# Patient Record
Sex: Male | Born: 1960 | Race: Black or African American | Hispanic: No | Marital: Single | Smoking: Never smoker
Health system: Southern US, Community
[De-identification: ages and names within clinical notes are randomized; demographics above are authoritative.]

## PROBLEM LIST (undated history)

## (undated) DIAGNOSIS — K4090 Unilateral inguinal hernia, without obstruction or gangrene, not specified as recurrent: Secondary | ICD-10-CM

## (undated) DIAGNOSIS — I350 Nonrheumatic aortic (valve) stenosis: Secondary | ICD-10-CM

## (undated) DIAGNOSIS — N400 Enlarged prostate without lower urinary tract symptoms: Secondary | ICD-10-CM

## (undated) DIAGNOSIS — C801 Malignant (primary) neoplasm, unspecified: Secondary | ICD-10-CM

## (undated) DIAGNOSIS — R011 Cardiac murmur, unspecified: Secondary | ICD-10-CM

## (undated) DIAGNOSIS — I10 Essential (primary) hypertension: Secondary | ICD-10-CM

## (undated) DIAGNOSIS — N189 Chronic kidney disease, unspecified: Secondary | ICD-10-CM

## (undated) HISTORY — PX: NO PAST SURGERIES: SHX2092

---

## 2019-05-26 ENCOUNTER — Other Ambulatory Visit: Payer: Self-pay | Admitting: Urology

## 2019-05-26 ENCOUNTER — Other Ambulatory Visit (HOSPITAL_COMMUNITY): Payer: Self-pay | Admitting: Urology

## 2019-05-26 DIAGNOSIS — C61 Malignant neoplasm of prostate: Secondary | ICD-10-CM

## 2019-06-23 ENCOUNTER — Encounter (HOSPITAL_COMMUNITY)
Admission: RE | Admit: 2019-06-23 | Discharge: 2019-06-23 | Disposition: A | Discharge status: Home / Self Care | Payer: BC Managed Care – PPO | Source: Ambulatory Visit | Attending: Urology | Admitting: Urology

## 2019-06-23 ENCOUNTER — Other Ambulatory Visit: Payer: Self-pay

## 2019-06-23 DIAGNOSIS — C61 Malignant neoplasm of prostate: Secondary | ICD-10-CM | POA: Insufficient documentation

## 2019-06-23 MED ORDER — TECHNETIUM TC 99M MEDRONATE IV KIT
21.8000 | PACK | Freq: Once | INTRAVENOUS | Status: AC | PRN
  Administered 2019-06-23: 11:00:00 21.8 via INTRAVENOUS

## 2019-07-09 ENCOUNTER — Other Ambulatory Visit (HOSPITAL_COMMUNITY): Payer: Self-pay | Admitting: Urology

## 2019-07-09 ENCOUNTER — Ambulatory Visit (HOSPITAL_COMMUNITY)
Admission: RE | Admit: 2019-07-09 | Discharge: 2019-07-09 | Disposition: A | Discharge status: Home / Self Care | Payer: BC Managed Care – PPO | Source: Ambulatory Visit | Attending: Urology | Admitting: Urology

## 2019-07-09 ENCOUNTER — Other Ambulatory Visit: Payer: Self-pay

## 2019-07-09 DIAGNOSIS — C61 Malignant neoplasm of prostate: Secondary | ICD-10-CM | POA: Diagnosis present

## 2019-07-24 ENCOUNTER — Other Ambulatory Visit: Payer: Self-pay | Admitting: Urology

## 2019-08-20 NOTE — Patient Instructions (Signed)
DUE TO COVID-19 ONLY ONE VISITOR IS ALLOWED TO COME WITH YOU AND STAY IN THE WAITING ROOM ONLY DURING PRE OP AND PROCEDURE DAY OF SURGERY. THE 1 VISITOR MAY VISIT WITH YOU AFTER SURGERY IN YOUR PRIVATE ROOM DURING VISITING HOURS ONLY!  YOU NEED TO HAVE A COVID 19 TEST ON_2/22/21______ @  1:45_______, THIS TEST MUST BE DONE BEFORE SURGERY, COME  [place], [place] , [address].  ([place]) ONCE YOUR COVID TEST IS COMPLETED, PLEASE BEGIN THE QUARANTINE INSTRUCTIONS AS OUTLINED IN YOUR HANDOUT.                Dollene Cleveland Nanda    Your procedure is scheduled on: 08/27/19   Report to [facility] Main  Entrance   Report to admitting at  9:15 AM     Call this number if you have problems the morning of surgery [phone]    Remember: Do not eat food or drink liquids :After Midnight  . BRUSH YOUR TEETH MORNING OF SURGERY AND RINSE YOUR MOUTH OUT, NO CHEWING GUM CANDY OR MINTS.     Take these medicines the morning of surgery with A SIP OF WATER: none  DO NOT TAKE ANY DIABETIC MEDICATIONS DAY OF YOUR SURGERY                               You may not have any metal on your body including              piercings  Do not wear jewelry,  lotions, powders or  deodorant                   Men may shave face and neck.   Do not bring valuables to the hospital. [place].  Contacts, dentures or bridgework may not be worn into surgery.       Special Instructions: N/A              Please read over the following fact sheets you were given: _____________________________________________________________________             [facility] - Preparing for Surgery  Before surgery, you can play an important role.   Because skin is not sterile, your skin needs to be as free of germs as possible.   You can reduce the number of germs on your skin by washing with CHG (chlorahexidine gluconate) soap before surgery.   CHG is  an antiseptic cleaner which kills germs and bonds with the skin to continue killing germs even after washing. Please DO NOT use if you have an allergy to CHG or antibacterial soaps.   If your skin becomes reddened/irritated stop using the CHG and inform your nurse when you arrive at Short Stay.   You may shave your face/neck.  Please follow these instructions carefully:  1.  Shower with CHG Soap the night before surgery and the  morning of Surgery.  2.  If you choose to wash your hair, wash your hair first as usual with your  normal  shampoo.  3.  After you shampoo, rinse your hair and body thoroughly to remove the  shampoo.  4.  Use CHG as you would any other liquid soap.  You can apply chg directly  to the skin and wash                       Gently with a scrungie or clean washcloth.  5.  Apply the CHG Soap to your body ONLY FROM THE NECK DOWN.   Do not use on face/ open                           Wound or open sores. Avoid contact with eyes, ears mouth and genitals (private parts).                       Wash face,  Genitals (private parts) with your normal soap.             6.  Wash thoroughly, paying special attention to the area where your surgery  will be performed.  7.  Thoroughly rinse your body with warm water from the neck down.  8.  DO NOT shower/wash with your normal soap after using and rinsing off  the CHG Soap.             9.  Pat yourself dry with a clean towel.            10.  Wear clean pajamas.            11.  Place clean sheets on your bed the night of your first shower and do not  sleep with pets. Day of Surgery : Do not apply any lotions/deodorants the morning of surgery.  Please wear clean clothes to the hospital/surgery center.  FAILURE TO FOLLOW THESE INSTRUCTIONS MAY RESULT IN THE CANCELLATION OF YOUR SURGERY PATIENT SIGNATURE_________________________________  NURSE  SIGNATURE__________________________________  ________________________________________________________________________   Adam Phenix  An incentive spirometer is a tool that can help keep your lungs clear and active. This tool measures how well you are filling your lungs with each breath. Taking long deep breaths may help reverse or decrease the chance of developing breathing (pulmonary) problems (especially infection) following:  A long period of time when you are unable to move or be active. BEFORE THE PROCEDURE   If the spirometer includes an indicator to show your best effort, your nurse or respiratory therapist will set it to a desired goal.  If possible, sit up straight or lean slightly forward. Try not to slouch.  Hold the incentive spirometer in an upright position. INSTRUCTIONS FOR USE  1. Sit on the edge of your bed if possible, or sit up as far as you can in bed or on a chair. 2. Hold the incentive spirometer in an upright position. 3. Breathe out normally. 4. Place the mouthpiece in your mouth and seal your lips tightly around it. 5. Breathe in slowly and as deeply as possible, raising the piston or the ball toward the top of the column. 6. Hold your breath for 3-5 seconds or for as long as possible. Allow the piston or ball to fall to the bottom of the column. 7. Remove the mouthpiece from your mouth and breathe out normally. 8. Rest for a few seconds and repeat Steps 1 through 7 at least 10 times every 1-2 hours when you are awake. Take your time and take a few normal breaths between deep breaths. 9. The spirometer may include an indicator to show your best effort.  Use the indicator as a goal to work toward during each repetition. 10. After each set of 10 deep breaths, practice coughing to be sure your lungs are clear. If you have an incision (the cut made at the time of surgery), support your incision when coughing by placing a pillow or rolled up towels firmly  against it. Once you are able to get out of bed, walk around indoors and cough well. You may stop using the incentive spirometer when instructed by your caregiver.  RISKS AND COMPLICATIONS  Take your time so you do not get dizzy or light-headed.  If you are in pain, you may need to take or ask for pain medication before doing incentive spirometry. It is harder to take a deep breath if you are having pain. AFTER USE  Rest and breathe slowly and easily.  It can be helpful to keep track of a log of your progress. Your caregiver can provide you with a simple table to help with this. If you are using the spirometer at home, follow these instructions: [place] IF:   You are having difficultly using the spirometer.  You have trouble using the spirometer as often as instructed.  Your pain medication is not giving enough relief while using the spirometer.  You develop fever of 100.5 F (38.1 C) or higher. SEEK IMMEDIATE MEDICAL CARE IF:   You cough up bloody sputum that had not been present before.  You develop fever of 102 F (38.9 C) or greater.  You develop worsening pain at or near the incision site. MAKE SURE YOU:   Understand these instructions.  Will watch your condition.  Will get help right away if you are not doing well or get worse. Document Released: 10/29/2006 Document Revised: 09/10/2011 Document Reviewed: 12/30/2006 Exit[employer] Patient Information 2014 [employer], [place].   ________________________________________________________________________  WHAT IS A BLOOD TRANSFUSION? Blood Transfusion Information  A transfusion is the replacement of blood or some of its parts. Blood is made up of multiple cells which provide different functions.  Red blood cells carry oxygen and are used for blood loss replacement.  White blood cells fight against infection.  Platelets control bleeding.  Plasma helps clot blood.  Other blood products are available for  specialized needs, such as hemophilia or other clotting disorders. BEFORE THE TRANSFUSION  Who gives blood for transfusions?   Healthy volunteers who are fully evaluated to make sure their blood is safe. This is blood bank blood. Transfusion therapy is the safest it has ever been in the practice of medicine. Before blood is taken from a donor, a complete history is taken to make sure that person has no history of diseases nor engages in risky social behavior (examples are intravenous drug use or sexual activity with multiple partners). The donor's travel history is screened to minimize risk of transmitting infections, such as malaria. The donated blood is tested for signs of infectious diseases, such as HIV and hepatitis. The blood is then tested to be sure it is compatible with you in order to minimize the chance of a transfusion reaction. If you or a relative donates blood, this is often done in anticipation of surgery and is not appropriate for emergency situations. It takes many days to process the donated blood. RISKS AND COMPLICATIONS Although transfusion therapy is very safe and saves many lives, the main dangers of transfusion include:   Getting an infectious disease.  Developing a transfusion reaction. This is an allergic reaction to something in the  blood you were given. Every precaution is taken to prevent this. The decision to have a blood transfusion has been considered carefully by your caregiver before blood is given. Blood is not given unless the benefits outweigh the risks. AFTER THE TRANSFUSION  Right after receiving a blood transfusion, you will usually feel much better and more energetic. This is especially true if your red blood cells have gotten low (anemic). The transfusion raises the level of the red blood cells which carry oxygen, and this usually causes an energy increase.  The nurse administering the transfusion will monitor you carefully for complications. HOME CARE  INSTRUCTIONS  No special instructions are needed after a transfusion. You may find your energy is better. Speak with your caregiver about any limitations on activity for underlying diseases you may have. SEEK MEDICAL CARE IF:   Your condition is not improving after your transfusion.  You develop redness or irritation at the intravenous (IV) site. SEEK IMMEDIATE MEDICAL CARE IF:  Any of the following symptoms occur over the next 12 hours:  Shaking chills.  You have a temperature by mouth above 102 F (38.9 C), not controlled by medicine.  Chest, back, or muscle pain.  People around you feel you are not acting correctly or are confused.  Shortness of breath or difficulty breathing.  Dizziness and fainting.  You get a rash or develop hives.  You have a decrease in urine output.  Your urine turns a dark color or changes to pink, red, or brown. Any of the following symptoms occur over the next 10 days:  You have a temperature by mouth above 102 F (38.9 C), not controlled by medicine.  Shortness of breath.  Weakness after normal activity.  The white part of the eye turns yellow (jaundice).  You have a decrease in the amount of urine or are urinating less often.  Your urine turns a dark color or changes to pink, red, or brown. Document Released: 06/15/2000 Document Revised: 09/10/2011 Document Reviewed: 02/02/2008 [facility] Patient Information 2014 [place], [place].  _______________________________________________________________________

## 2019-08-21 ENCOUNTER — Encounter (HOSPITAL_COMMUNITY)
Admission: RE | Admit: 2019-08-21 | Discharge: 2019-08-21 | Disposition: A | Discharge status: Home / Self Care | Payer: BC Managed Care – PPO | Source: Ambulatory Visit | Attending: Urology | Admitting: Urology

## 2019-08-21 ENCOUNTER — Other Ambulatory Visit: Payer: Self-pay

## 2019-08-21 ENCOUNTER — Encounter (HOSPITAL_COMMUNITY): Payer: Self-pay

## 2019-08-21 HISTORY — DX: Malignant (primary) neoplasm, unspecified: C80.1

## 2019-08-24 ENCOUNTER — Encounter (HOSPITAL_COMMUNITY)
Admission: RE | Admit: 2019-08-24 | Discharge: 2019-08-24 | Disposition: A | Discharge status: Home / Self Care | Payer: BC Managed Care – PPO | Source: Ambulatory Visit | Attending: Urology | Admitting: Urology

## 2019-08-24 ENCOUNTER — Other Ambulatory Visit (HOSPITAL_COMMUNITY)
Admission: RE | Admit: 2019-08-24 | Discharge: 2019-08-24 | Disposition: A | Discharge status: Home / Self Care | Payer: BC Managed Care – PPO | Source: Ambulatory Visit | Attending: Urology | Admitting: Urology

## 2019-08-24 ENCOUNTER — Other Ambulatory Visit: Payer: Self-pay

## 2019-08-24 DIAGNOSIS — Z01812 Encounter for preprocedural laboratory examination: Secondary | ICD-10-CM | POA: Insufficient documentation

## 2019-08-24 LAB — BASIC METABOLIC PANEL
Anion gap: 8 (ref 5–15)
BUN: 18 mg/dL (ref 6–20)
CO2: 25 mmol/L (ref 22–32)
Calcium: 9.4 mg/dL (ref 8.9–10.3)
Chloride: 106 mmol/L (ref 98–111)
Creatinine, Ser: 1.22 mg/dL (ref 0.61–1.24)
GFR calc Af Amer: >60 mL/min (ref >60)
GFR calc non Af Amer: >60 mL/min (ref >60)
Glucose, Bld: 112 mg/dL — ABNORMAL HIGH (ref 70–99)
Potassium: 4.5 mmol/L (ref 3.5–5.1)
Sodium: 139 mmol/L (ref 135–145)

## 2019-08-24 LAB — CBC
HCT: 42.5 % (ref 39.0–52.0)
Hemoglobin: 14.5 g/dL (ref 13.0–17.0)
MCH: 33 pg (ref 26.0–34.0)
MCHC: 34.1 g/dL (ref 30.0–36.0)
MCV: 96.6 fL (ref 80.0–100.0)
Platelets: 176 10*3/uL (ref 150–400)
RBC: 4.4 MIL/uL (ref 4.22–5.81)
RDW: 12.4 % (ref 11.5–15.5)
WBC: 3.6 10*3/uL — ABNORMAL LOW (ref 4.0–10.5)
nRBC: 0 % (ref 0.0–0.2)

## 2019-08-24 LAB — SARS CORONAVIRUS 2 (TAT 6-24 HRS): SARS Coronavirus 2: NEGATIVE

## 2019-08-24 LAB — SURGICAL PCR SCREEN
MRSA, PCR: NEGATIVE
Staphylococcus aureus: POSITIVE — AB

## 2019-08-24 LAB — ABO/RH: ABO/RH(D): O POS

## 2019-08-25 NOTE — Progress Notes (Signed)
PCR result routed to Dr. Alinda Money for review

## 2019-08-26 NOTE — H&P (Signed)
CC: Prostate Cancer    Kevin Wilson is a 59 year old gentleman who was noted to have an elevated PSA of 13.88 and a left apical 1 cm nodule. 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   Family history: He apparently has 2 uncles that have had prostate cancer.   Imaging studies:  CT scan (06/23/19): Negative for metastatic disease.  Bone scan (06/23/19): Mild uptake in cervical, thoracic, and lumbar spine that could be degenerative or metastatic disease  Plain films (07/08/18): Cervical, thoracic, and lumbar plain films suggest degenerative changes without metastatic lesions   PMH: He no medical comorbid conditions.  PSH: No abdominal surgeries.   TNM stage: cT2 N0 M0  PSA: 13.38  Gleason score: 4+5=9 (Grade group 5)  Biopsy (05/14/19): 6/12 cores positive  Left: L lateral mid (70%, 4+3=7), L mid (40%, 3+4=7), L lateral base (90%, 4+3=7, PNI, EPE), L base (70%, 4+5=9, PNI)  Right: R mid (10%, 4+3=7), R base (40%, 4+5=9)  Prostate volume: 19.6 cc   Nomogram  OC disease: 9%  EPE: 88%  SVI: 46%  LNI: 47%  PFS (5 year, 10 year): 28%, 17%   Urinary function: IPSS is 2.  Erectile function: SHIM score is 18. He can reliably have erections adequate for intercourse without the need for medical therapy.     ALLERGIES: NKDA    MEDICATIONS: None   GU PSH: Locm 300-399Mg /Ml Iodine,1Ml - 06/23/2019 Prostate Needle Biopsy - 05/14/2019 Vasectomy     NON-GU PSH: Surgical Pathology, Gross And Microscopic Examination For Prostate Needle - 05/14/2019     GU PMH: Prostate Cancer - 07/13/2019, The PSA over 10 and the presence of Gleason 9 on his biopsy we discussed the fact that he is at high risk and because of that imaging of the bones as well as a CT scan would be obtained. Once I have these results we can then discuss options for treatment., -  05/26/2019 Prostate nodule w/o LUTS, He does have a left apical nodule noted on exam. In addition with an elevated PSA I think he almost certainly is going to need undergo prostate biopsy regardless of what his PSA returns. - 04/09/2019    NON-GU PMH: None   FAMILY HISTORY: 1 Daughter - Daughter   SOCIAL HISTORY: Marital Status: Single Preferred Language: English; Ethnicity: Not Hispanic Or Latino; Race: Black or African American Current Smoking Status: Patient has never smoked.   Tobacco Use Assessment Completed: Used Tobacco in last 30 days? Has never drank.  Drinks 4+ caffeinated drinks per day.    REVIEW OF SYSTEMS:    GU Review Male:   Patient denies frequent urination, hard to postpone urination, burning/ pain with urination, get up at night to urinate, leakage of urine, stream starts and stops, trouble starting your streams, and have to strain to urinate .  Gastrointestinal (Lower):   Patient denies diarrhea and constipation.  Gastrointestinal (Upper):   Patient denies vomiting and nausea.  Constitutional:   Patient denies fever, night sweats, weight loss, and fatigue.  Skin:   Patient denies skin rash/ lesion and itching.  Eyes:   Patient denies blurred vision and double vision.  Ears/ Nose/ Throat:   Patient denies sore throat and sinus problems.  Hematologic/Lymphatic:   Patient denies swollen glands and easy bruising.  Cardiovascular:   Patient denies leg swelling and chest pains.  Respiratory:   Patient denies cough and shortness of breath.  Endocrine:   Patient denies excessive thirst.  Musculoskeletal:   Patient denies back pain and joint pain.  Neurological:   Patient denies headaches and dizziness.  Psychologic:   Patient denies depression and anxiety.   VITAL SIGNS:     Weight 215 lb / 97.52 kg  Height 73.5 in / 186.69 cm  BMI 28.0 kg/m   MULTI-SYSTEM PHYSICAL EXAMINATION:    Constitutional: Well-nourished. No physical deformities. Normally developed. Good  grooming.  Neck: Neck symmetrical, not swollen. Normal tracheal position.  Respiratory: No labored breathing, no use of accessory muscles. Clear bilaterally.  Cardiovascular: Normal temperature, normal extremity pulses, no swelling, no varicosities. Regular rate and rhythm.  Lymphatic: No enlargement of neck, axillae, groin.  Skin: No paleness, no jaundice, no cyanosis. No lesion, no ulcer, no rash.  Neurologic / Psychiatric: Oriented to time, oriented to place, oriented to person. No depression, no anxiety, no agitation.  Gastrointestinal: No mass, no tenderness, no rigidity, non obese abdomen.  Eyes: Normal conjunctivae. Normal eyelids.  Ears, Nose, Mouth, and Throat: Left ear no scars, no lesions, no masses. Right ear no scars, no lesions, no masses. Nose no scars, no lesions, no masses. Normal hearing. Normal lips.  Musculoskeletal: Normal gait and station of head and neck.     PAST DATA REVIEWED:  Source Of History:  Patient  Lab Test Review:   PSA  Records Review:   Pathology Reports, Previous Patient Records  X-Ray Review: Outside X-Ray: Reviewed Films.  C.T. Abdomen/Pelvis: Reviewed Films.  Bone Scan: Reviewed Films.     04/09/19  PSA  Total PSA 10.50 ng/mL   Notes:                     CLINICAL DATA: New diagnosis of prostate cancer.   EXAM:  CT ABDOMEN AND PELVIS WITH CONTRAST   TECHNIQUE:  Multidetector CT imaging of the abdomen and pelvis was performed  using the standard protocol following bolus administration of  intravenous contrast.   CONTRAST: 100 cc Omnipaque 300   COMPARISON: None.   FINDINGS:  Lower chest: The lung bases are clear of acute process. No pleural  effusion or pulmonary lesions. The heart is normal in size. No  pericardial effusion. The distal esophagus and aorta are  unremarkable.   Hepatobiliary: No focal hepatic lesions or intrahepatic biliary  dilatation. The gallbladder appears normal. No common bile duct  dilatation.   Pancreas: No  mass, inflammation or ductal dilatation.   Spleen: Normal size. No focal lesions.   Adrenals/Urinary Tract: Adrenal glands are normal.   No renal, ureteral or bladder calculi or mass. The delayed images do  not demonstrate any significant collecting system abnormalities.   Stomach/Bowel: The stomach, duodenum, small bowel and colon are  grossly normal without oral contrast. No inflammatory changes, mass  lesions or obstructive findings. The terminal ileum and appendix are  normal.   Vascular/Lymphatic: The aorta is normal in caliber. No dissection.  The branch vessels are patent. The major venous structures are  patent. No mesenteric or retroperitoneal mass or adenopathy. Small  scattered lymph nodes are noted.   Reproductive: The prostate gland and seminal vesicles are  unremarkable.   Other: Slightly prominent inguinal rings containing fat. No  abdominal wall hernia.   Musculoskeletal: Small benign-appearing lipoma noted in the external  oblique muscle on the right side. The bony structures are  unremarkable. No worrisome sclerotic bone lesions to suggest  metastatic disease. Moderate facet disease noted in the lower lumbar  spine.   Moderate bilateral SI joint degenerative changes.   IMPRESSION:  1. No CT findings to suggest metastatic prostate cancer.  2. No acute abdominal/pelvic findings, mass lesions or adenopathy.    Electronically Signed  By: Marijo Sanes M.D.  On: 06/23/2019 11:15   CLINICAL DATA: Prostate cancer.   EXAM:  NUCLEAR MEDICINE WHOLE BODY BONE SCAN   TECHNIQUE:  Whole body anterior and posterior images were obtained approximately  3 hours after intravenous injection of radiopharmaceutical.   RADIOPHARMACEUTICALS: 21.8 mCi Technetium-49m MDP IV   COMPARISON: CT 06/23/2019.   FINDINGS:  Bilateral renal function excretion. Mild increased activity noted  over both shoulders and sternoclavicular joints, most likely  degenerative. Mild focal  areas of increased activity noted over the  cervical, thoracic, and lumbar spine. These may be degenerative.  Cervical spine, thoracic, and lumbar spine series can be obtained to  further evaluate. Mild increased activity noted over both knees,  most likely degenerative.   IMPRESSION:  1. Mild increased activity noted over both shoulders and  sternoclavicular joints. Mild increased activity noted over both  knees. These findings are most likely secondary to degenerative  disease.   2. Mild focal areas of increased activity noted over the cervical,  thoracic, and lumbar spine. These may be degenerative. Cervical  spine, thoracic spine, and lumbar spine series can be obtained to  further evaluate.    Electronically Signed  By: Marcello Moores Register  On: 06/24/2019 05:59   CLINICAL DATA: Known history of prostate carcinoma.   EXAM:  LUMBAR SPINE - COMPLETE 4+ VIEW   COMPARISON: None.   FINDINGS:  Five lumbar type vertebral bodies are well visualized. Vertebral  body height is well maintained. No pars defects are seen. Mild  osteophytic changes are seen. Mild disc space narrowing at L4-5 and  L5-S1 is noted. No sclerotic lesions are identified.   IMPRESSION:  Degenerative change without evidence of metastatic disease.    Electronically Signed  By: Inez Catalina M.D.  On: 07/09/2019 21:02   CLINICAL DATA: History of prostate carcinoma   EXAM:  THORACIC SPINE 2 VIEWS   COMPARISON: None.   FINDINGS:  Pedicles are within normal limits. No paraspinal mass is noted. Mild  osteophytic changes of the thoracic spine are seen. No compression  deformity is noted. No sclerotic foci are noted.   IMPRESSION:  Degenerative change without acute abnormality.    Electronically Signed  By: Inez Catalina M.D.  On: 07/09/2019 21:03   CLINICAL DATA: Prostate carcinoma, possible metastatic disease.   EXAM:  CERVICAL SPINE - COMPLETE 4+ VIEW   COMPARISON: None.   FINDINGS:  Seven  cervical segments are well visualized. Vertebral body height  is well maintained. Disc space narrowing at C5-6 is noted.  Osteophytic changes are noted at C4-5, C5-6 and C6-7. No soft tissue  abnormality is noted.   IMPRESSION:  Multilevel degenerative changes are noted without acute abnormality.    Electronically Signed  By: Inez Catalina M.D.  On: 01/07/2  ASSESSMENT:      ICD-10 Details  1 GU:   Prostate Cancer - C61    PLAN:     1. Locally advanced, very high risk prostate cancer: I had a detailed discussion with Kevin Wilson today regarding his prostate cancer situation. He understands that he has high-risk but potentially curable disease based on the absence of any definite metastatic disease considering his imaging studies. We did discuss options for treatment  any has a clear understanding that he likely will require multi modality therapy. The patient was counseled about the natural history of prostate cancer and the standard treatment options that are available for prostate cancer. It was explained to him how his age and life expectancy, clinical stage, Gleason score, and PSA affect his prognosis, the decision to proceed with additional staging studies, as well as how that information influences recommended treatment strategies. We discussed the roles for active surveillance, radiation therapy, surgical therapy, androgen deprivation, as well as ablative therapy options for the treatment of prostate cancer as appropriate to his individual cancer situation. We discussed the risks and benefits of these options with regard to their impact on cancer control and also in terms of potential adverse events, complications, and impact on quality of life particularly related to urinary and sexual function. The patient was encouraged to ask questions throughout the discussion today and all questions were answered to his stated satisfaction. In addition, the patient was provided with and/or directed to  appropriate resources and literature for further education about prostate cancer and treatment options. We discussed surgical therapy for prostate cancer including the different available surgical approaches. We discussed, in detail, the risks and expectations of surgery with regard to cancer control, urinary control, and erectile function as well as the expected postoperative recovery process. Additional risks of surgery including but not limited to bleeding, infection, hernia formation, nerve damage, lymphocele formation, bowel/rectal injury potentially necessitating colostomy, damage to the urinary tract resulting in urine leakage, urethral stricture, and the cardiopulmonary risks such as myocardial infarction, stroke, death, venothromboembolism, etc. were explained. The risk of open surgical conversion for robotic/laparoscopic prostatectomy was also discussed.   After an in-depth discussion, he does wish to proceed with primary surgical therapy. He understands the possible need for adjuvant or salvage therapy in the future considering his high risk situation. He will be scheduled for a unilateral right nerve-sparing robot assisted laparoscopic radical prostatectomy and bilateral pelvic lymphadenectomy.

## 2019-08-27 ENCOUNTER — Ambulatory Visit (HOSPITAL_COMMUNITY): Payer: BC Managed Care – PPO | Admitting: Anesthesiology

## 2019-08-27 ENCOUNTER — Encounter (HOSPITAL_COMMUNITY)
Admission: RE | Disposition: A | Discharge status: Home / Self Care | Payer: Self-pay | Source: Other Acute Inpatient Hospital | Attending: Urology

## 2019-08-27 ENCOUNTER — Ambulatory Visit (HOSPITAL_COMMUNITY): Payer: BC Managed Care – PPO | Admitting: Physician Assistant

## 2019-08-27 ENCOUNTER — Encounter (HOSPITAL_COMMUNITY): Payer: Self-pay | Admitting: Urology

## 2019-08-27 ENCOUNTER — Observation Stay (HOSPITAL_COMMUNITY)
Admission: RE | Admit: 2019-08-27 | Discharge: 2019-08-28 | Disposition: A | Discharge status: Home / Self Care | Payer: BC Managed Care – PPO | Source: Other Acute Inpatient Hospital | Attending: Urology | Admitting: Urology

## 2019-08-27 DIAGNOSIS — C61 Malignant neoplasm of prostate: Principal | ICD-10-CM | POA: Diagnosis present

## 2019-08-27 DIAGNOSIS — Z8042 Family history of malignant neoplasm of prostate: Secondary | ICD-10-CM | POA: Insufficient documentation

## 2019-08-27 HISTORY — PX: LYMPHADENECTOMY: SHX5960

## 2019-08-27 HISTORY — PX: ROBOT ASSISTED LAPAROSCOPIC RADICAL PROSTATECTOMY: SHX5141

## 2019-08-27 LAB — HEMOGLOBIN AND HEMATOCRIT, BLOOD
HCT: 39.8 % (ref 39.0–52.0)
Hemoglobin: 13.5 g/dL (ref 13.0–17.0)

## 2019-08-27 SURGERY — XI ROBOTIC ASSISTED LAPAROSCOPIC RADICAL PROSTATECTOMY LEVEL 2
Anesthesia: General

## 2019-08-27 MED ORDER — HYDROMORPHONE HCL 1 MG/ML IJ SOLN
0.2500 mg | INTRAMUSCULAR | Status: DC | PRN
  Administered 2019-08-27 (×3): 0.5 mg via INTRAVENOUS

## 2019-08-27 MED ORDER — HEPARIN SODIUM (PORCINE) 1000 UNIT/ML IJ SOLN
INTRAMUSCULAR | Status: AC
  Filled 2019-08-27: qty 1

## 2019-08-27 MED ORDER — PROPOFOL 10 MG/ML IV BOLUS
INTRAVENOUS | Status: DC | PRN
  Administered 2019-08-27: 150 mg via INTRAVENOUS

## 2019-08-27 MED ORDER — MAGNESIUM CITRATE PO SOLN
1.0000 | Freq: Once | ORAL | Status: DC
  Filled 2019-08-27: qty 296

## 2019-08-27 MED ORDER — ZOLPIDEM TARTRATE 5 MG PO TABS: 5.0000 mg | ORAL_TABLET | Freq: Every evening | ORAL | Status: DC | PRN

## 2019-08-27 MED ORDER — DEXAMETHASONE SODIUM PHOSPHATE 10 MG/ML IJ SOLN
INTRAMUSCULAR | Status: AC
  Filled 2019-08-27: qty 1

## 2019-08-27 MED ORDER — ONDANSETRON HCL 4 MG/2ML IJ SOLN: 4.0000 mg | INTRAMUSCULAR | Status: DC | PRN

## 2019-08-27 MED ORDER — SODIUM CHLORIDE (PF) 0.9 % IJ SOLN
INTRAMUSCULAR | Status: AC
  Filled 2019-08-27: qty 10

## 2019-08-27 MED ORDER — LIDOCAINE 2% (20 MG/ML) 5 ML SYRINGE
INTRAMUSCULAR | Status: AC
  Filled 2019-08-27: qty 5

## 2019-08-27 MED ORDER — MORPHINE SULFATE (PF) 2 MG/ML IV SOLN
2.0000 mg | INTRAVENOUS | Status: DC | PRN
  Administered 2019-08-27 (×2): 2 mg via INTRAVENOUS
  Filled 2019-08-27 (×2): qty 1

## 2019-08-27 MED ORDER — BACITRACIN-NEOMYCIN-POLYMYXIN 400-5-5000 EX OINT: 1.0000 "application " | TOPICAL_OINTMENT | Freq: Three times a day (TID) | CUTANEOUS | Status: DC | PRN

## 2019-08-27 MED ORDER — HYDROMORPHONE HCL 2 MG/ML IJ SOLN
INTRAMUSCULAR | Status: AC
  Filled 2019-08-27: qty 1

## 2019-08-27 MED ORDER — BUPIVACAINE HCL 0.25 % IJ SOLN
INTRAMUSCULAR | Status: AC
  Filled 2019-08-27: qty 1

## 2019-08-27 MED ORDER — CHLORHEXIDINE GLUCONATE CLOTH 2 % EX PADS: 6.0000 | MEDICATED_PAD | Freq: Every day | CUTANEOUS | Status: DC

## 2019-08-27 MED ORDER — LACTATED RINGERS IV SOLN: INTRAVENOUS | Status: DC

## 2019-08-27 MED ORDER — LIDOCAINE HCL (CARDIAC) PF 100 MG/5ML IV SOSY
PREFILLED_SYRINGE | INTRAVENOUS | Status: DC | PRN
  Administered 2019-08-27: 100 mg via INTRAVENOUS

## 2019-08-27 MED ORDER — EPHEDRINE SULFATE 50 MG/ML IJ SOLN
INTRAMUSCULAR | Status: DC | PRN
  Administered 2019-08-27 (×2): 5 mg via INTRAVENOUS

## 2019-08-27 MED ORDER — FENTANYL CITRATE (PF) 100 MCG/2ML IJ SOLN
INTRAMUSCULAR | Status: DC | PRN
  Administered 2019-08-27: 50 ug via INTRAVENOUS
  Administered 2019-08-27: 150 ug via INTRAVENOUS
  Administered 2019-08-27: 50 ug via INTRAVENOUS

## 2019-08-27 MED ORDER — SUGAMMADEX SODIUM 500 MG/5ML IV SOLN
INTRAVENOUS | Status: AC
  Filled 2019-08-27: qty 5

## 2019-08-27 MED ORDER — KCL IN DEXTROSE-NACL 20-5-0.45 MEQ/L-%-% IV SOLN
INTRAVENOUS | Status: DC
  Filled 2019-08-27 (×4): qty 1000

## 2019-08-27 MED ORDER — EPHEDRINE 5 MG/ML INJ
INTRAVENOUS | Status: AC
  Filled 2019-08-27: qty 10

## 2019-08-27 MED ORDER — SULFAMETHOXAZOLE-TRIMETHOPRIM 800-160 MG PO TABS: 1.0000 | ORAL_TABLET | Freq: Two times a day (BID) | ORAL | 0 refills | Status: DC

## 2019-08-27 MED ORDER — ONDANSETRON HCL 4 MG/2ML IJ SOLN
INTRAMUSCULAR | Status: AC
  Filled 2019-08-27: qty 2

## 2019-08-27 MED ORDER — DIPHENHYDRAMINE HCL 50 MG/ML IJ SOLN: 12.5000 mg | Freq: Four times a day (QID) | INTRAMUSCULAR | Status: DC | PRN

## 2019-08-27 MED ORDER — SODIUM CHLORIDE 0.9 % IR SOLN
Status: DC | PRN
  Administered 2019-08-27: 1000 mL via INTRAVESICAL

## 2019-08-27 MED ORDER — ROCURONIUM BROMIDE 100 MG/10ML IV SOLN
INTRAVENOUS | Status: DC | PRN
  Administered 2019-08-27: 100 mg via INTRAVENOUS
  Administered 2019-08-27 (×2): 20 mg via INTRAVENOUS

## 2019-08-27 MED ORDER — CEFAZOLIN SODIUM-DEXTROSE 1-4 GM/50ML-% IV SOLN
1.0000 g | Freq: Three times a day (TID) | INTRAVENOUS | Status: AC
  Administered 2019-08-27 – 2019-08-28 (×2): 1 g via INTRAVENOUS
  Filled 2019-08-27 (×2): qty 50

## 2019-08-27 MED ORDER — SODIUM CHLORIDE 0.9 % IV BOLUS: 1000.0000 mL | Freq: Once | INTRAVENOUS | Status: DC

## 2019-08-27 MED ORDER — ONDANSETRON HCL 4 MG/2ML IJ SOLN
4.0000 mg | Freq: Once | INTRAMUSCULAR | Status: AC | PRN
  Administered 2019-08-27: 4 mg via INTRAVENOUS

## 2019-08-27 MED ORDER — FLEET ENEMA 7-19 GM/118ML RE ENEM
1.0000 | ENEMA | Freq: Once | RECTAL | Status: DC
  Filled 2019-08-27: qty 1

## 2019-08-27 MED ORDER — FENTANYL CITRATE (PF) 250 MCG/5ML IJ SOLN
INTRAMUSCULAR | Status: AC
  Filled 2019-08-27: qty 5

## 2019-08-27 MED ORDER — DEXAMETHASONE SODIUM PHOSPHATE 10 MG/ML IJ SOLN
INTRAMUSCULAR | Status: DC | PRN
  Administered 2019-08-27: 4 mg via INTRAVENOUS

## 2019-08-27 MED ORDER — MIDAZOLAM HCL 2 MG/2ML IJ SOLN
INTRAMUSCULAR | Status: AC
  Filled 2019-08-27: qty 2

## 2019-08-27 MED ORDER — BUPIVACAINE-EPINEPHRINE 0.25% -1:200000 IJ SOLN
INTRAMUSCULAR | Status: DC | PRN
  Administered 2019-08-27: 20 mL

## 2019-08-27 MED ORDER — BELLADONNA ALKALOIDS-OPIUM 16.2-60 MG RE SUPP: 1.0000 | Freq: Four times a day (QID) | RECTAL | Status: DC | PRN

## 2019-08-27 MED ORDER — SUGAMMADEX SODIUM 500 MG/5ML IV SOLN
INTRAVENOUS | Status: DC | PRN
  Administered 2019-08-27: 250 mg via INTRAVENOUS

## 2019-08-27 MED ORDER — PROPOFOL 10 MG/ML IV BOLUS
INTRAVENOUS | Status: AC
  Filled 2019-08-27: qty 20

## 2019-08-27 MED ORDER — HYDROMORPHONE HCL 1 MG/ML IJ SOLN
INTRAMUSCULAR | Status: DC | PRN
  Administered 2019-08-27 (×4): .5 mg via INTRAVENOUS

## 2019-08-27 MED ORDER — ONDANSETRON HCL 4 MG/2ML IJ SOLN
INTRAMUSCULAR | Status: DC | PRN
  Administered 2019-08-27: 4 mg via INTRAVENOUS

## 2019-08-27 MED ORDER — MEPERIDINE HCL 50 MG/ML IJ SOLN: 6.2500 mg | INTRAMUSCULAR | Status: DC | PRN

## 2019-08-27 MED ORDER — ACETAMINOPHEN 325 MG PO TABS: 650.0000 mg | ORAL_TABLET | ORAL | Status: DC | PRN

## 2019-08-27 MED ORDER — LACTATED RINGERS IV SOLN: INTRAVENOUS | Status: DC | PRN

## 2019-08-27 MED ORDER — ROCURONIUM BROMIDE 10 MG/ML (PF) SYRINGE
PREFILLED_SYRINGE | INTRAVENOUS | Status: AC
  Filled 2019-08-27: qty 10

## 2019-08-27 MED ORDER — CEFAZOLIN SODIUM-DEXTROSE 2-4 GM/100ML-% IV SOLN
2.0000 g | Freq: Once | INTRAVENOUS | Status: AC
  Administered 2019-08-27: 2 g via INTRAVENOUS
  Filled 2019-08-27: qty 100

## 2019-08-27 MED ORDER — MIDAZOLAM HCL 5 MG/5ML IJ SOLN
INTRAMUSCULAR | Status: DC | PRN
  Administered 2019-08-27: 2 mg via INTRAVENOUS

## 2019-08-27 MED ORDER — HYDROMORPHONE HCL 1 MG/ML IJ SOLN
INTRAMUSCULAR | Status: AC
  Filled 2019-08-27: qty 2

## 2019-08-27 MED ORDER — KETOROLAC TROMETHAMINE 15 MG/ML IJ SOLN
15.0000 mg | Freq: Four times a day (QID) | INTRAMUSCULAR | Status: DC
  Administered 2019-08-27 – 2019-08-28 (×3): 15 mg via INTRAVENOUS
  Filled 2019-08-27 (×3): qty 1

## 2019-08-27 MED ORDER — TRAMADOL HCL 50 MG PO TABS: 50.0000 mg | ORAL_TABLET | Freq: Four times a day (QID) | ORAL | 0 refills | Status: DC | PRN

## 2019-08-27 MED ORDER — DIPHENHYDRAMINE HCL 12.5 MG/5ML PO ELIX: 12.5000 mg | ORAL_SOLUTION | Freq: Four times a day (QID) | ORAL | Status: DC | PRN

## 2019-08-27 MED ORDER — DOCUSATE SODIUM 100 MG PO CAPS
100.0000 mg | ORAL_CAPSULE | Freq: Two times a day (BID) | ORAL | Status: DC
  Administered 2019-08-27 – 2019-08-28 (×2): 100 mg via ORAL
  Filled 2019-08-27 (×2): qty 1

## 2019-08-27 SURGICAL SUPPLY — 60 items
APPLICATOR COTTON TIP 6 STRL (MISCELLANEOUS) ×2 IMPLANT
APPLICATOR COTTON TIP 6IN STRL (MISCELLANEOUS) ×4
CATH FOLEY 2WAY SLVR 18FR 30CC (CATHETERS) ×4 IMPLANT
CATH ROBINSON RED A/P 16FR (CATHETERS) ×4 IMPLANT
CATH ROBINSON RED A/P 8FR (CATHETERS) ×4 IMPLANT
CATH TIEMANN FOLEY 18FR 5CC (CATHETERS) ×4 IMPLANT
CHLORAPREP W/TINT 26 (MISCELLANEOUS) ×4 IMPLANT
CLIP VESOLOCK LG 6/CT PURPLE (CLIP) ×8 IMPLANT
COVER SURGICAL LIGHT HANDLE (MISCELLANEOUS) ×4 IMPLANT
COVER TIP SHEARS 8 DVNC (MISCELLANEOUS) ×2 IMPLANT
COVER TIP SHEARS 8MM DA VINCI (MISCELLANEOUS) ×2
COVER WAND RF STERILE (DRAPES) IMPLANT
CUTTER ECHEON FLEX ENDO 45 340 (ENDOMECHANICALS) ×4 IMPLANT
DECANTER SPIKE VIAL GLASS SM (MISCELLANEOUS) ×4 IMPLANT
DERMABOND ADVANCED (GAUZE/BANDAGES/DRESSINGS) ×2
DERMABOND ADVANCED .7 DNX12 (GAUZE/BANDAGES/DRESSINGS) ×2 IMPLANT
DRAIN CHANNEL RND F F (WOUND CARE) IMPLANT
DRAPE ARM DVNC X/XI (DISPOSABLE) ×8 IMPLANT
DRAPE COLUMN DVNC XI (DISPOSABLE) ×2 IMPLANT
DRAPE DA VINCI XI ARM (DISPOSABLE) ×8
DRAPE DA VINCI XI COLUMN (DISPOSABLE) ×2
DRAPE SURG IRRIG POUCH 19X23 (DRAPES) ×4 IMPLANT
DRSG TEGADERM 4X4.75 (GAUZE/BANDAGES/DRESSINGS) ×4 IMPLANT
ELECT REM PT RETURN 15FT ADLT (MISCELLANEOUS) ×4 IMPLANT
GLOVE BIO SURGEON STRL SZ 6.5 (GLOVE) ×3 IMPLANT
GLOVE BIO SURGEONS STRL SZ 6.5 (GLOVE) ×1
GLOVE BIOGEL M STRL SZ7.5 (GLOVE) ×8 IMPLANT
GOWN STRL REUS W/TWL LRG LVL3 (GOWN DISPOSABLE) ×12 IMPLANT
HOLDER FOLEY CATH W/STRAP (MISCELLANEOUS) ×4 IMPLANT
IRRIG SUCT STRYKERFLOW 2 WTIP (MISCELLANEOUS) ×4
IRRIGATION SUCT STRKRFLW 2 WTP (MISCELLANEOUS) ×2 IMPLANT
IV LACTATED RINGERS 1000ML (IV SOLUTION) ×4 IMPLANT
KIT TURNOVER KIT A (KITS) IMPLANT
MARKER SKIN DUAL TIP RULER LAB (MISCELLANEOUS) ×4 IMPLANT
NDL SAFETY ECLIPSE 18X1.5 (NEEDLE) ×2 IMPLANT
NEEDLE HYPO 18GX1.5 SHARP (NEEDLE) ×2
PACK ROBOT UROLOGY CUSTOM (CUSTOM PROCEDURE TRAY) ×4 IMPLANT
PENCIL SMOKE EVACUATOR (MISCELLANEOUS) IMPLANT
SEAL CANN UNIV 5-8 DVNC XI (MISCELLANEOUS) ×8 IMPLANT
SEAL XI 5MM-8MM UNIVERSAL (MISCELLANEOUS) ×8
SET TUBE SMOKE EVAC HIGH FLOW (TUBING) IMPLANT
SOLUTION ELECTROLUBE (MISCELLANEOUS) ×4 IMPLANT
STAPLE RELOAD 45 GRN (STAPLE) ×2 IMPLANT
STAPLE RELOAD 45MM GREEN (STAPLE) ×2
SUT ETHILON 3 0 PS 1 (SUTURE) ×4 IMPLANT
SUT MNCRL 3 0 RB1 (SUTURE) ×2 IMPLANT
SUT MNCRL 3 0 VIOLET RB1 (SUTURE) ×2 IMPLANT
SUT MNCRL AB 4-0 PS2 18 (SUTURE) ×8 IMPLANT
SUT MONOCRYL 3 0 RB1 (SUTURE) ×4
SUT VIC AB 0 CT1 27 (SUTURE) ×2
SUT VIC AB 0 CT1 27XBRD ANTBC (SUTURE) ×2 IMPLANT
SUT VIC AB 0 UR5 27 (SUTURE) ×4 IMPLANT
SUT VIC AB 2-0 SH 27 (SUTURE) ×2
SUT VIC AB 2-0 SH 27X BRD (SUTURE) ×2 IMPLANT
SUT VICRYL 0 UR6 27IN ABS (SUTURE) ×8 IMPLANT
SYR 27GX1/2 1ML LL SAFETY (SYRINGE) ×4 IMPLANT
TOWEL OR NON WOVEN STRL DISP B (DISPOSABLE) ×4 IMPLANT
TROCAR XCEL NON-BLD 5MMX100MML (ENDOMECHANICALS) IMPLANT
TUBING INSUFFLATION 10FT LAP (TUBING) ×4 IMPLANT
WATER STERILE IRR 1000ML POUR (IV SOLUTION) ×4 IMPLANT

## 2019-08-27 NOTE — Anesthesia Procedure Notes (Signed)
Procedure Name: Intubation Date/Time: 08/27/2019 11:12 AM Performed by: Glory Buff, CRNA Pre-anesthesia Checklist: Patient identified, Emergency Drugs available, Suction available and Patient being monitored Patient Re-evaluated:Patient Re-evaluated prior to induction Oxygen Delivery Method: Circle system utilized Preoxygenation: Pre-oxygenation with 100% oxygen Induction Type: IV induction Ventilation: Mask ventilation without difficulty Laryngoscope Size: Miller and 3 Grade View: Grade I Tube type: Oral Tube size: 7.5 mm Number of attempts: 1 Airway Equipment and Method: Stylet and Oral airway Placement Confirmation: ETT inserted through vocal cords under direct vision,  positive ETCO2 and breath sounds checked- equal and bilateral Secured at: 22 cm Tube secured with: Tape Dental Injury: Teeth and Oropharynx as per pre-operative assessment

## 2019-08-27 NOTE — Anesthesia Preprocedure Evaluation (Addendum)
Anesthesia Evaluation  Patient identified by MRN, date of birth, ID band Patient awake    Reviewed: Allergy & Precautions, NPO status , Patient's Chart, lab work & pertinent test results  Airway Mallampati: I  TM Distance: >3 FB Neck ROM: Full    Dental   Pulmonary    Pulmonary exam normal        Cardiovascular Normal cardiovascular exam     Neuro/Psych    GI/Hepatic   Endo/Other    Renal/GU      Musculoskeletal   Abdominal   Peds  Hematology   Anesthesia Other Findings   Reproductive/Obstetrics                             Anesthesia Physical Anesthesia Plan  ASA: II  Anesthesia Plan: General   Post-op Pain Management:    Induction: Intravenous  PONV Risk Score and Plan: 2 and Ondansetron and Midazolam  Airway Management Planned: Oral ETT  Additional Equipment:   Intra-op Plan:   Post-operative Plan: Extubation in OR  Informed Consent: I have reviewed the patients History and Physical, chart, labs and discussed the procedure including the risks, benefits and alternatives for the proposed anesthesia with the patient or authorized representative who has indicated his/her understanding and acceptance.       Plan Discussed with: CRNA and Surgeon  Anesthesia Plan Comments:         Anesthesia Quick Evaluation  

## 2019-08-27 NOTE — Progress Notes (Signed)
Patient ID: Kevin Wilson. Cancel, male   DOB: 28-Jan-1961, 59 y.o.   MRN: [id number] Post-op note  Subjective: The patient is doing well. Had some nausea but this is improving. Sleepy.   Objective: Vital signs in last 24 hours: Temp:  [98.1 F (36.7 C)-98.5 F (36.9 C)] 98.1 F (36.7 C) (02/25 1400) Pulse Rate:  [67-74] 72 (02/25 1445) Resp:  [11-21] 11 (02/25 1445) BP: (143-174)/(73-97) 147/73 (02/25 1445) SpO2:  [100 %] 100 % (02/25 1445)  Intake/Output from previous day: No intake/output data recorded. Intake/Output this shift: Total I/O In: 1350 [I.V.:1250; IV Piggyback:100] Out: 175 [Urine:100; Drains:25; Blood:50]  Physical Exam:  General: Alert and oriented. Abdomen: Soft, Nondistended. Incisions: Clean and dry. Urine: red  Lab Results: No results for input(s): HGB, HCT in the last 72 hours.  Assessment/Plan: POD#0   1) Continue to monitor  2) DVT prophy, clears, IS, amb, pain control   LOS: 0 days   Debbrah Alar 08/27/2019, 3:00 PM

## 2019-08-27 NOTE — Op Note (Signed)
Preoperative diagnosis: Clinically localized adenocarcinoma of the prostate (clinical stage T1c N0 M0)  Postoperative diagnosis: Clinically localized adenocarcinoma of the prostate (clinical stage T1c N0 M0)  Procedure:  1. Robotic assisted laparoscopic radical prostatectomy (right nerve sparing) 2. Bilateral robotic assisted laparoscopic pelvic lymphadenectomy  Surgeon: Pryor Curia. M.D.  Assistant(s): Debbrah Alar, PA-C  An assistant was required for this surgical procedure.  The duties of the assistant included but were not limited to suctioning, passing suture, camera manipulation, retraction. This procedure would not be able to be performed without an Environmental consultant.   Anesthesia: General  Complications: None  EBL: 50 mL  IVF:  1500 mL crystalloid  Specimens: 1. Prostate and seminal vesicles 2. Right pelvic lymph nodes 3. Left pelvic lymph nodes  Disposition of specimens: Pathology  Drains: 1. 20 Fr coude catheter 2. # 19 Blake pelvic drain  Indication: Kevin Wilson is a 59 y.o. patient with clinically localized prostate cancer.  After a thorough review of the management options for treatment of prostate cancer, he elected to proceed with surgical therapy and the above procedure(s).  We have discussed the potential benefits and risks of the procedure, side effects of the proposed treatment, the likelihood of the patient achieving the goals of the procedure, and any potential problems that might occur during the procedure or recuperation. Informed consent has been obtained.  Description of procedure:  The patient was taken to the operating room and a general anesthetic was administered. He was given preoperative antibiotics, placed in the dorsal lithotomy position, and prepped and draped in the usual sterile fashion. Next a preoperative timeout was performed. A urethral catheter was placed into the bladder and a site was selected near the umbilicus for placement of  the camera port. This was placed using a standard open Hassan technique which allowed entry into the peritoneal cavity under direct vision and without difficulty. An 8 mm port was placed and a pneumoperitoneum established. The camera was then used to inspect the abdomen and there was no evidence of any intra-abdominal injuries or other abnormalities. The remaining abdominal ports were then placed. 8 mm robotic ports were placed in the right lower quadrant, left lower quadrant, and far left lateral abdominal wall. A 5 mm port was placed in the right upper quadrant and a 12 mm port was placed in the right lateral abdominal wall for laparoscopic assistance. All ports were placed under direct vision without difficulty. The surgical cart was then docked.   Utilizing the cautery scissors, the bladder was reflected posteriorly allowing entry into the space of Retzius and identification of the endopelvic fascia and prostate. The periprostatic fat was then removed from the prostate allowing full exposure of the endopelvic fascia. The endopelvic fascia was then incised from the apex back to the base of the prostate bilaterally and the underlying levator muscle fibers were swept laterally off the prostate thereby isolating the dorsal venous complex. The dorsal vein was then stapled and divided with a 45 mm Flex Echelon stapler. Attention then turned to the bladder neck which was divided anteriorly thereby allowing entry into the bladder and exposure of the urethral catheter. The catheter balloon was deflated and the catheter was brought into the operative field and used to retract the prostate anteriorly. The posterior bladder neck was then examined and was divided allowing further dissection between the bladder and prostate posteriorly until the vasa deferentia and seminal vessels were identified. The vasa deferentia were isolated, divided, and lifted anteriorly. The seminal  vesicles were dissected down to their tips with  care to control the seminal vascular arterial blood supply. These structures were then lifted anteriorly and the space between Denonvillier's fascia and the anterior rectum was developed with a combination of sharp and blunt dissection. This isolated the vascular pedicles of the prostate.  The lateral prostatic fascia on the right side of the prostate was then sharply incised allowing release of the neurovascular bundle. The vascular pedicle of the prostate on the right side was then ligated with Weck clips between the prostate and neurovascular bundle and divided with sharp cold scissor dissection resulting in neurovascular bundle preservation. On the left side, a wide non nerve sparing dissection was performed with Weck clips used to ligate the vascular pedicle of the prostate. The neurovascular bundle on the right side was then separated off the apex of the prostate and urethra.  The urethra was then sharply transected allowing the prostate specimen to be disarticulated. The pelvis was copiously irrigated and hemostasis was ensured. There was no evidence for rectal injury.  Attention then turned to the right pelvic sidewall. The fibrofatty tissue between the external iliac vein, confluence of the iliac vessels, hypogastric artery, and Cooper's ligament was dissected free from the pelvic sidewall with care to preserve the obturator nerve. Weck clips were used for lymphostasis and hemostasis. An identical procedure was performed on the contralateral side and the lymphatic packets were removed for permanent pathologic analysis.  Attention then turned to the urethral anastomosis. A 2-0 Vicryl slip knot was placed between Denonvillier's fascia, the posterior bladder neck, and the posterior urethra to reapproximate these structures. A double-armed 3-0 Monocryl suture was then used to perform a 360 running tension-free anastomosis between the bladder neck and urethra. A new urethral catheter was then placed  into the bladder and irrigated. There were no blood clots within the bladder and the anastomosis appeared to be watertight. A #19 Blake drain was then brought through the left lateral 8 mm port site and positioned appropriately within the pelvis. It was secured to the skin with a nylon suture. The surgical cart was then undocked. The right lateral 12 mm port site was closed at the fascial level with a 0 Vicryl suture placed laparoscopically. All remaining ports were then removed under direct vision. The prostate specimen was removed intact within the Endopouch retrieval bag via the periumbilical camera port site. This fascial opening was closed with two running 0 Vicryl sutures. 0.25% Marcaine was then injected into all port sites and all incisions were reapproximated at the skin level with 4-0 Monocryl subcuticular sutures. Dermabond was applied. The patient appeared to tolerate the procedure well and without complications. The patient was able to be extubated and transferred to the recovery unit in satisfactory condition.   Pryor Curia MD

## 2019-08-27 NOTE — Anesthesia Postprocedure Evaluation (Signed)
Anesthesia Post Note  Patient: Kevin Wilson. Kevin Wilson  Procedure(s) Performed: XI ROBOTIC ASSISTED LAPAROSCOPIC RADICAL PROSTATECTOMY LEVEL 2 (N/A ) LYMPHADENECTOMY, PELVIC (Bilateral )     Patient location during evaluation: PACU Anesthesia Type: General Level of consciousness: awake and alert Pain management: pain level controlled Vital Signs Assessment: post-procedure vital signs reviewed and stable Respiratory status: spontaneous breathing, nonlabored ventilation, respiratory function stable and patient connected to nasal cannula oxygen Cardiovascular status: blood pressure returned to baseline and stable Postop Assessment: no apparent nausea or vomiting Anesthetic complications: no    Last Vitals:  Vitals:   08/27/19 1500 08/27/19 1515  BP: (!) 147/76   Pulse: 77 69  Resp: 11 16  Temp:    SpO2: 100% 100%    Last Pain:  Vitals:   08/27/19 1515  TempSrc:   PainSc: [place] DAVID

## 2019-08-27 NOTE — Progress Notes (Signed)
Patient ambulated in hallway once on 7 a to 7 p shift, dry heaving with movement.

## 2019-08-27 NOTE — Discharge Instructions (Signed)

## 2019-08-27 NOTE — Transfer of Care (Signed)
Immediate Anesthesia Transfer of Care Note  Patient: Kevin Wilson. Hackley  Procedure(s) Performed: XI ROBOTIC ASSISTED LAPAROSCOPIC RADICAL PROSTATECTOMY LEVEL 2 (N/A ) LYMPHADENECTOMY, PELVIC (Bilateral )  Patient Location: PACU  Anesthesia Type:General  Level of Consciousness: drowsy, patient cooperative and responds to stimulation  Airway & Oxygen Therapy: Patient Spontanous Breathing and Patient connected to face mask oxygen  Post-op Assessment: Report given to RN and Post -op Vital signs reviewed and stable  Post vital signs: Reviewed and stable  Last Vitals:  Vitals Value Taken Time  BP 148/75 08/27/19 1353  Temp    Pulse 68 08/27/19 1355  Resp 17 08/27/19 1355  SpO2 100 % 08/27/19 1355  Vitals shown include unvalidated device data.  Last Pain:  Vitals:   08/27/19 1011  TempSrc:   PainSc: 0-No pain         Complications: No apparent anesthesia complications

## 2019-08-28 DIAGNOSIS — C61 Malignant neoplasm of prostate: Secondary | ICD-10-CM | POA: Diagnosis not present

## 2019-08-28 LAB — TYPE AND SCREEN
ABO/RH(D): O POS
Antibody Screen: NEGATIVE

## 2019-08-28 LAB — HEMOGLOBIN AND HEMATOCRIT, BLOOD
HCT: 38.1 % — ABNORMAL LOW (ref 39.0–52.0)
Hemoglobin: 12.6 g/dL — ABNORMAL LOW (ref 13.0–17.0)

## 2019-08-28 MED ORDER — BISACODYL 10 MG RE SUPP
10.0000 mg | Freq: Once | RECTAL | Status: AC
  Administered 2019-08-28: 10 mg via RECTAL
  Filled 2019-08-28: qty 1

## 2019-08-28 MED ORDER — TRAMADOL HCL 50 MG PO TABS: 50.0000 mg | ORAL_TABLET | Freq: Four times a day (QID) | ORAL | Status: DC | PRN

## 2019-08-28 NOTE — Progress Notes (Signed)
Patient ID: Kevin Wilson. Kevin Wilson, male   DOB: January 01, 1961, 59 y.o.   MRN: [id number]  1 Day Post-Op Subjective: The patient is doing well.  No nausea or vomiting. Pain is adequately controlled.  Objective: Vital signs in last 24 hours: Temp:  [97.5 F (36.4 C)-98.5 F (36.9 C)] 97.7 F (36.5 C) (02/25 2328) Pulse Rate:  [66-79] 66 (02/25 2328) Resp:  [11-21] 18 (02/25 2328) BP: (109-174)/(60-97) 109/60 (02/25 2328) SpO2:  [98 %-100 %] 98 % (02/25 2328)  Intake/Output from previous day: 02/25 0701 - 02/26 0700 In: 2931.4 [P.O.:360; I.V.:1471.4; IV Piggyback:1100] Out: 1090 [Urine:875; Drains:165; Blood:50] Intake/Output this shift: No intake/output data recorded.  Physical Exam:  General: Alert and oriented. CV: RRR Lungs: Clear bilaterally. GI: Soft, Nondistended. Incisions: Clean, dry, and intact Urine: Clear Extremities: Nontender, no erythema, no edema.  Lab Results: Recent Labs    08/27/19 1509 08/28/19 0355  HGB 13.5 12.6*  HCT 39.8 38.1*      Assessment/Plan: POD# 1 s/p robotic prostatectomy.  1) SL IVF 2) Ambulate, Incentive spirometry 3) Transition to oral pain medication 4) Dulcolax suppository 5) D/C pelvic drain 6) Plan for likely discharge later today   Kevin Wilson. MD   LOS: 0 days   Kevin Wilson 08/28/2019, 7:14 AM

## 2019-08-28 NOTE — Discharge Summary (Signed)
  Date of admission: 08/27/2019  Date of discharge: 08/28/2019  Admission diagnosis: Prostate Cancer  Discharge diagnosis: Prostate Cancer  History and Physical: For full details, please see admission history and physical. Briefly, Kevin Wilson is a 59 y.o. gentleman with localized prostate cancer.  After discussing management/treatment options, he elected to proceed with surgical treatment.  Hospital Course: Kevin Wilson was taken to the operating room on 08/27/2019 and underwent a robotic assisted laparoscopic radical prostatectomy. He tolerated this procedure well and without complications. Postoperatively, he was able to be transferred to a regular hospital room following recovery from anesthesia.  He was able to begin ambulating the night of surgery. He remained hemodynamically stable overnight.  He had excellent urine output with appropriately minimal output from his pelvic drain and his pelvic drain was removed on POD #1.  He was transitioned to oral pain medication, tolerated a clear liquid diet, and had met all discharge criteria and was able to be discharged home later on POD#1.  Laboratory values:  Recent Labs    08/27/19 1509 08/28/19 0355  HGB 13.5 12.6*  HCT 39.8 38.1*    Disposition: Home  Discharge instruction: He was instructed to be ambulatory but to refrain from heavy lifting, strenuous activity, or driving. He was instructed on urethral catheter care.  Discharge medications:   Allergies as of 08/28/2019   No Known Allergies     Medication List    TAKE these medications   sulfamethoxazole-trimethoprim 800-160 MG tablet Commonly known as: BACTRIM DS Take 1 tablet by mouth 2 (two) times daily. Start the day prior to foley removal appointment   traMADol 50 MG tablet Commonly known as: Ultram Take 1-2 tablets (50-100 mg total) by mouth every 6 (six) hours as needed for moderate pain or severe pain.       Followup: He will followup in 1 week for catheter  removal and to discuss his surgical pathology results.

## 2019-09-01 LAB — SURGICAL PATHOLOGY

## 2020-07-11 IMAGING — DX DG LUMBAR SPINE COMPLETE 4+V
5 series · 5 of 5 positions shown · non-contrast
Comparison: None.

CLINICAL DATA: Known history of prostate carcinoma.

EXAM:
LUMBAR SPINE - COMPLETE 4+ VIEW

[l-spine ap]
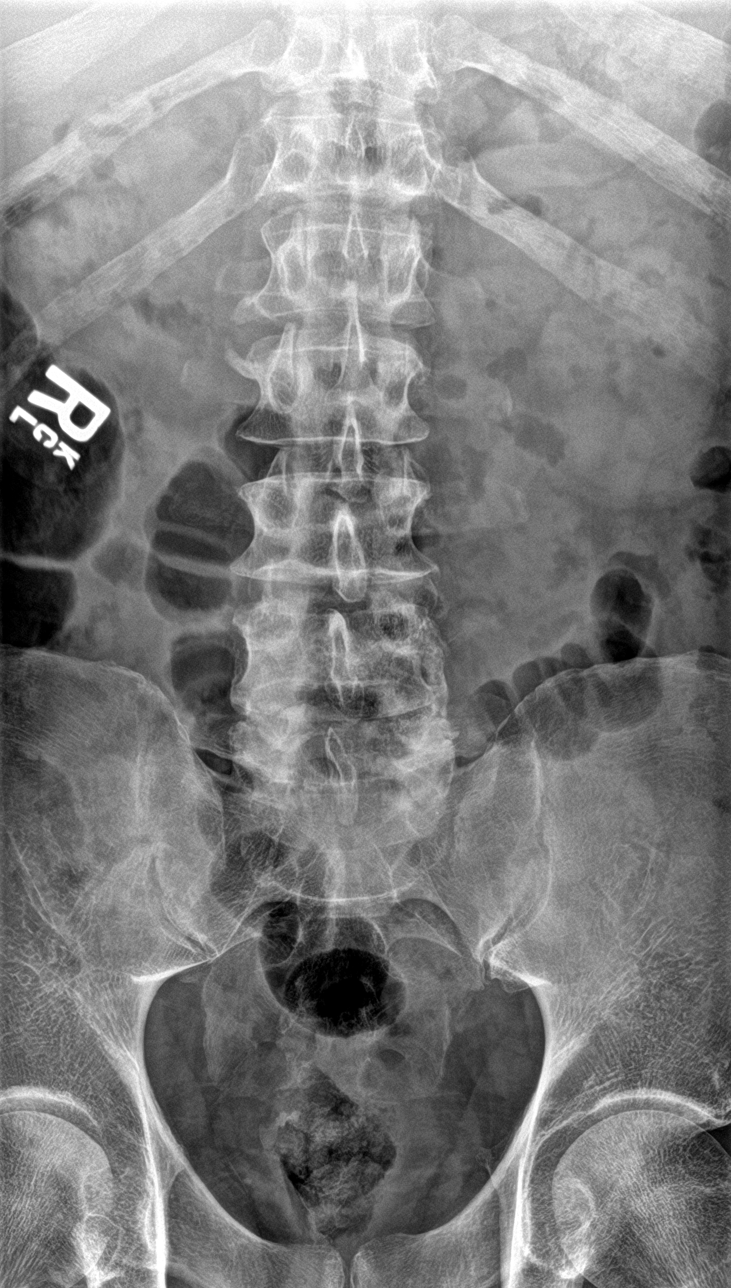

[l-spine obl (1 of 2)]
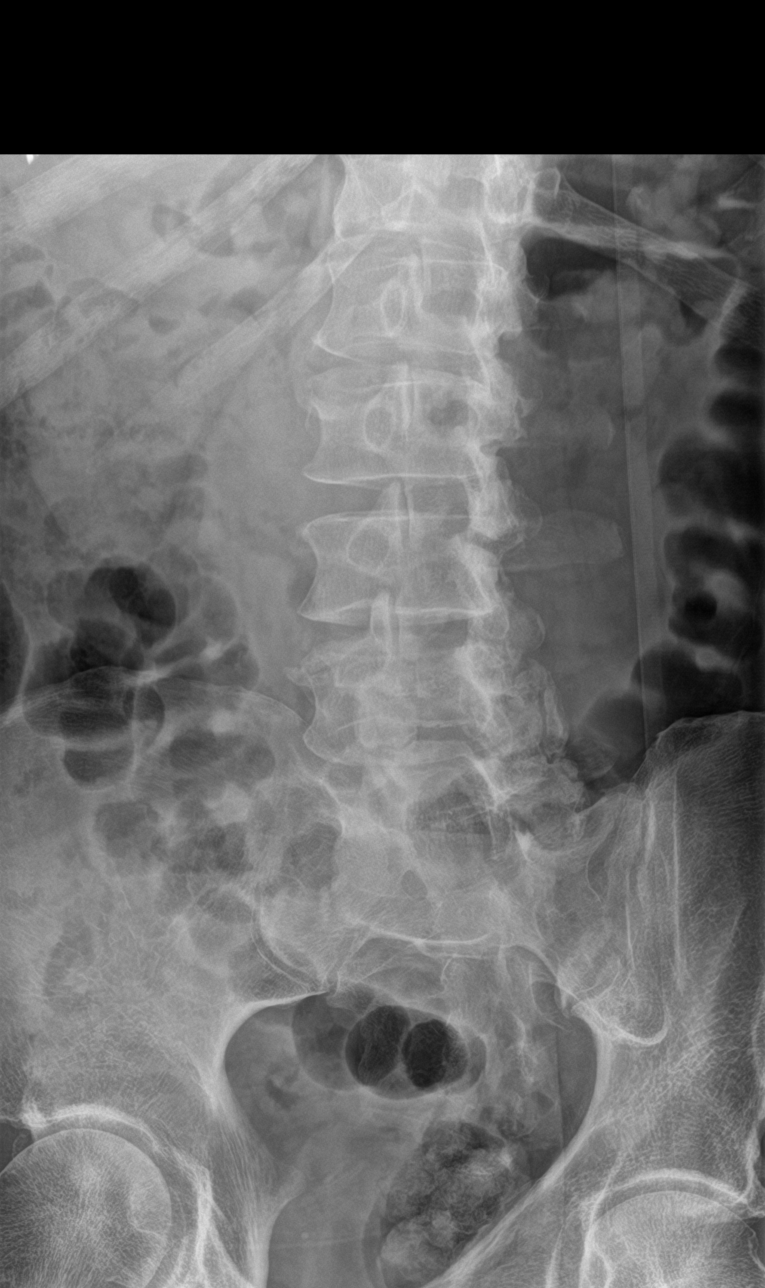

[l-spine obl (2 of 2)]
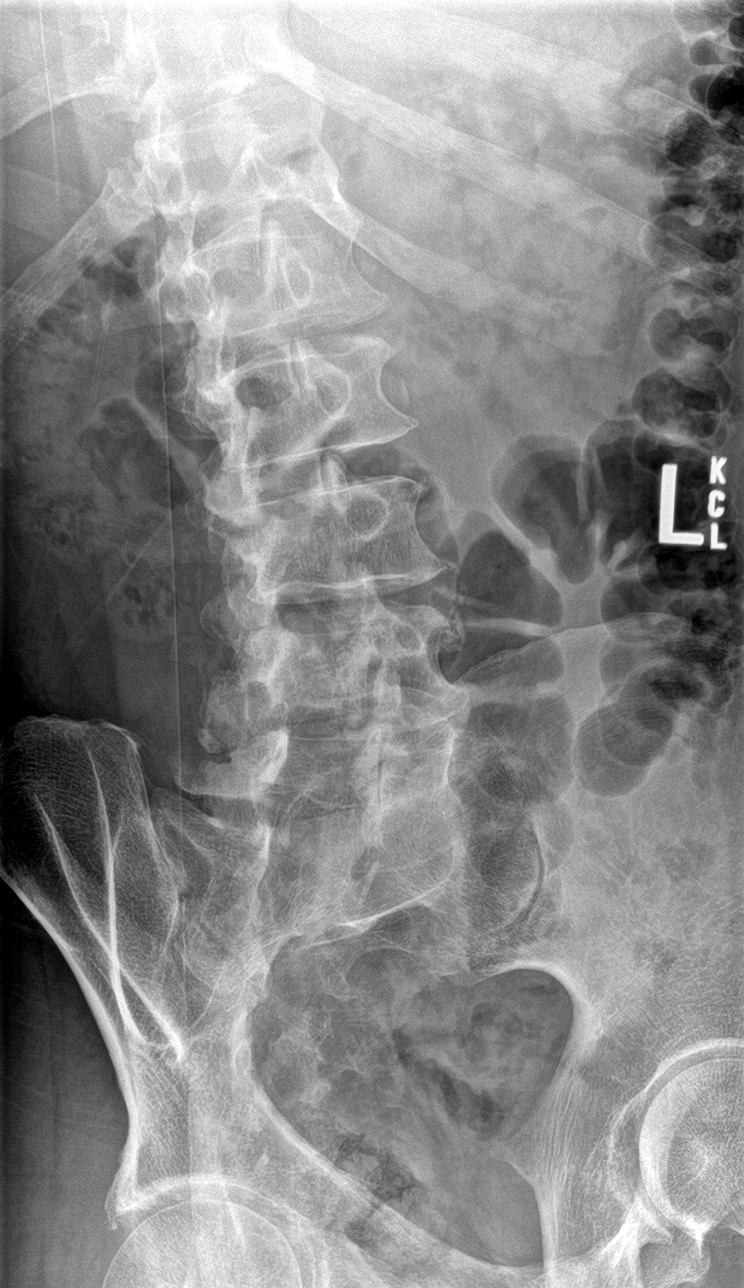

[l-spine lat]
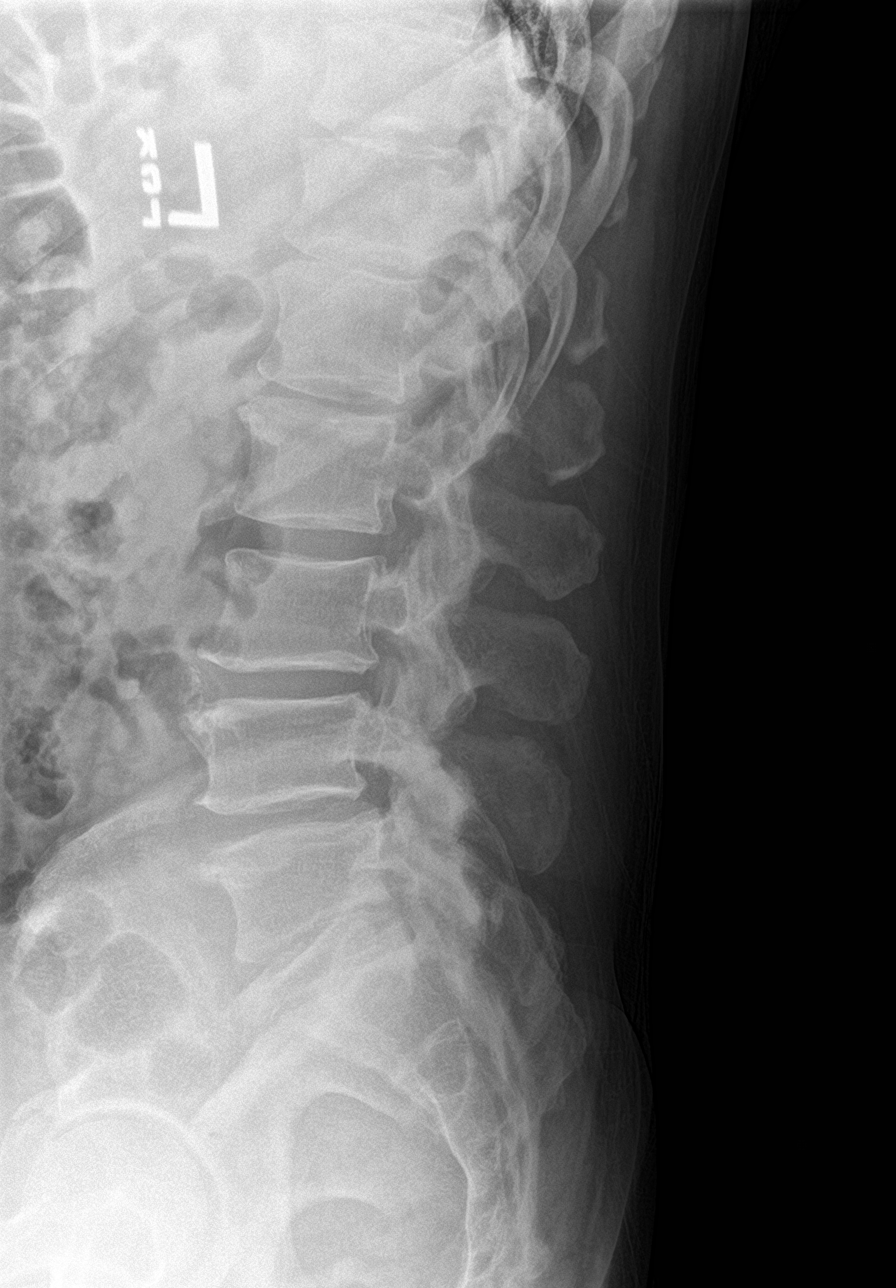

[l-spine spot]
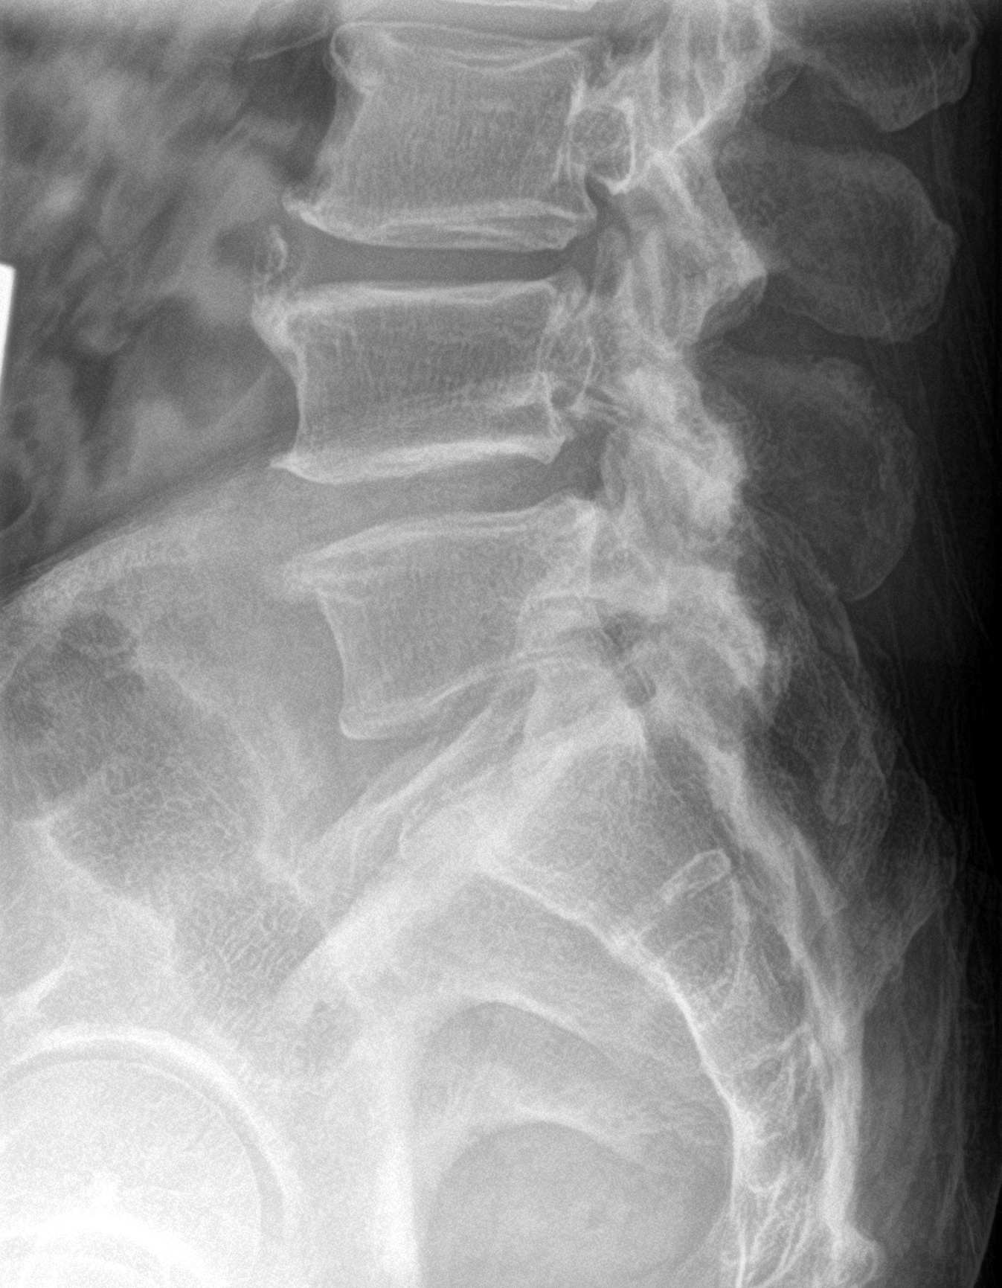

[5 of 5 positions shown; findings below may reference images not displayed]

FINDINGS: Five lumbar type vertebral bodies are well visualized. Vertebral
body height is well maintained. No pars defects are seen. Mild
osteophytic changes are seen. Mild disc space narrowing at L4-5 and
L5-S1 is noted. No sclerotic lesions are identified.
IMPRESSION: Degenerative change without evidence of metastatic disease.

## 2023-12-20 ENCOUNTER — Inpatient Hospital Stay (HOSPITAL_COMMUNITY)
Admission: EM | Admit: 2023-12-20 | Discharge: 2023-12-27 | DRG: 028 | Disposition: A | Discharge status: Skilled Nursing Facility | Payer: Self-pay | Attending: Neurosurgery | Admitting: Neurosurgery

## 2023-12-20 ENCOUNTER — Encounter (HOSPITAL_COMMUNITY): Payer: Self-pay

## 2023-12-20 ENCOUNTER — Encounter (HOSPITAL_COMMUNITY): Payer: Self-pay | Admitting: Neurosurgery

## 2023-12-20 DIAGNOSIS — C7951 Secondary malignant neoplasm of bone: Secondary | ICD-10-CM | POA: Diagnosis present

## 2023-12-20 DIAGNOSIS — I10 Essential (primary) hypertension: Secondary | ICD-10-CM | POA: Diagnosis not present

## 2023-12-20 DIAGNOSIS — G959 Disease of spinal cord, unspecified: Secondary | ICD-10-CM | POA: Diagnosis not present

## 2023-12-20 DIAGNOSIS — G893 Neoplasm related pain (acute) (chronic): Secondary | ICD-10-CM | POA: Diagnosis present

## 2023-12-20 DIAGNOSIS — C412 Malignant neoplasm of vertebral column: Secondary | ICD-10-CM | POA: Diagnosis not present

## 2023-12-20 DIAGNOSIS — Z800 Family history of malignant neoplasm of digestive organs: Secondary | ICD-10-CM | POA: Diagnosis not present

## 2023-12-20 DIAGNOSIS — D630 Anemia in neoplastic disease: Secondary | ICD-10-CM | POA: Diagnosis present

## 2023-12-20 DIAGNOSIS — I129 Hypertensive chronic kidney disease with stage 1 through stage 4 chronic kidney disease, or unspecified chronic kidney disease: Secondary | ICD-10-CM | POA: Diagnosis present

## 2023-12-20 DIAGNOSIS — G8250 Quadriplegia, unspecified: Secondary | ICD-10-CM | POA: Diagnosis present

## 2023-12-20 DIAGNOSIS — M4804 Spinal stenosis, thoracic region: Secondary | ICD-10-CM | POA: Diagnosis present

## 2023-12-20 DIAGNOSIS — N184 Chronic kidney disease, stage 4 (severe): Secondary | ICD-10-CM | POA: Diagnosis present

## 2023-12-20 DIAGNOSIS — Z885 Allergy status to narcotic agent status: Secondary | ICD-10-CM

## 2023-12-20 DIAGNOSIS — Z9221 Personal history of antineoplastic chemotherapy: Secondary | ICD-10-CM | POA: Diagnosis not present

## 2023-12-20 DIAGNOSIS — Z9079 Acquired absence of other genital organ(s): Secondary | ICD-10-CM

## 2023-12-20 DIAGNOSIS — C7949 Secondary malignant neoplasm of other parts of nervous system: Principal | ICD-10-CM | POA: Diagnosis present

## 2023-12-20 DIAGNOSIS — Z888 Allergy status to other drugs, medicaments and biological substances status: Secondary | ICD-10-CM | POA: Diagnosis not present

## 2023-12-20 DIAGNOSIS — Z95828 Presence of other vascular implants and grafts: Secondary | ICD-10-CM

## 2023-12-20 DIAGNOSIS — R292 Abnormal reflex: Secondary | ICD-10-CM | POA: Diagnosis present

## 2023-12-20 DIAGNOSIS — N189 Chronic kidney disease, unspecified: Secondary | ICD-10-CM | POA: Diagnosis present

## 2023-12-20 DIAGNOSIS — G992 Myelopathy in diseases classified elsewhere: Secondary | ICD-10-CM | POA: Diagnosis present

## 2023-12-20 DIAGNOSIS — M4802 Spinal stenosis, cervical region: Secondary | ICD-10-CM | POA: Diagnosis present

## 2023-12-20 DIAGNOSIS — G9529 Other cord compression: Secondary | ICD-10-CM | POA: Diagnosis present

## 2023-12-20 DIAGNOSIS — N400 Enlarged prostate without lower urinary tract symptoms: Secondary | ICD-10-CM | POA: Diagnosis present

## 2023-12-20 DIAGNOSIS — R531 Weakness: Secondary | ICD-10-CM | POA: Diagnosis present

## 2023-12-20 DIAGNOSIS — Z923 Personal history of irradiation: Secondary | ICD-10-CM

## 2023-12-20 DIAGNOSIS — Z79899 Other long term (current) drug therapy: Secondary | ICD-10-CM

## 2023-12-20 DIAGNOSIS — Z8546 Personal history of malignant neoplasm of prostate: Secondary | ICD-10-CM | POA: Diagnosis not present

## 2023-12-20 HISTORY — DX: Nonrheumatic aortic (valve) stenosis: I35.0

## 2023-12-20 HISTORY — DX: Cardiac murmur, unspecified: R01.1

## 2023-12-20 HISTORY — DX: Unilateral inguinal hernia, without obstruction or gangrene, not specified as recurrent: K40.90

## 2023-12-20 HISTORY — DX: Essential (primary) hypertension: I10

## 2023-12-20 HISTORY — DX: Chronic kidney disease, unspecified: N18.9

## 2023-12-20 HISTORY — DX: Benign prostatic hyperplasia without lower urinary tract symptoms: N40.0

## 2023-12-20 MED ORDER — ORAL CARE MOUTH RINSE: 15.0000 mL | OROMUCOSAL | Status: DC | PRN

## 2023-12-20 MED ORDER — DEXAMETHASONE SODIUM PHOSPHATE 10 MG/ML IJ SOLN
10.0000 mg | Freq: Once | INTRAMUSCULAR | Status: AC
  Administered 2023-12-21: 10 mg via INTRAVENOUS
  Filled 2023-12-20: qty 1

## 2023-12-20 MED ORDER — HYDROMORPHONE HCL 1 MG/ML IJ SOLN
0.5000 mg | INTRAMUSCULAR | Status: DC | PRN
  Administered 2023-12-21 – 2023-12-22 (×2): 1 mg via INTRAVENOUS
  Administered 2023-12-23: 0.5 mg via INTRAVENOUS
  Administered 2023-12-23 – 2023-12-24 (×3): 1 mg via INTRAVENOUS
  Filled 2023-12-20 (×7): qty 1

## 2023-12-20 MED ORDER — METHOCARBAMOL 1000 MG/10ML IJ SOLN
500.0000 mg | Freq: Four times a day (QID) | INTRAMUSCULAR | Status: DC | PRN
Start: 2023-12-20 — End: 2023-12-27
  Administered 2023-12-22 – 2023-12-24 (×3): 500 mg via INTRAVENOUS
  Filled 2023-12-20 (×3): qty 10

## 2023-12-20 MED ORDER — ACETAMINOPHEN 650 MG RE SUPP: 650.0000 mg | Freq: Four times a day (QID) | RECTAL | Status: DC | PRN

## 2023-12-20 MED ORDER — BISACODYL 5 MG PO TBEC
5.0000 mg | DELAYED_RELEASE_TABLET | Freq: Every day | ORAL | Status: DC | PRN
  Administered 2023-12-23 – 2023-12-27 (×2): 5 mg via ORAL
  Filled 2023-12-20 (×2): qty 1

## 2023-12-20 MED ORDER — DEXAMETHASONE SODIUM PHOSPHATE 4 MG/ML IJ SOLN
4.0000 mg | Freq: Four times a day (QID) | INTRAMUSCULAR | Status: DC
  Administered 2023-12-21 – 2023-12-27 (×26): 4 mg via INTRAVENOUS
  Filled 2023-12-20 (×27): qty 1

## 2023-12-20 MED ORDER — SODIUM CHLORIDE 0.9 % IV SOLN: INTRAVENOUS | Status: AC

## 2023-12-20 MED ORDER — ACETAMINOPHEN 325 MG PO TABS: 650.0000 mg | ORAL_TABLET | Freq: Four times a day (QID) | ORAL | Status: DC | PRN

## 2023-12-20 MED ORDER — CHLORHEXIDINE GLUCONATE CLOTH 2 % EX PADS
6.0000 | MEDICATED_PAD | Freq: Every day | CUTANEOUS | Status: DC
  Administered 2023-12-22 – 2023-12-27 (×6): 6 via TOPICAL

## 2023-12-20 MED ORDER — OXYCODONE HCL 5 MG PO TABS
5.0000 mg | ORAL_TABLET | ORAL | Status: DC | PRN
  Administered 2023-12-21 – 2023-12-25 (×10): 5 mg via ORAL
  Filled 2023-12-20 (×10): qty 1

## 2023-12-20 MED ORDER — ONDANSETRON HCL 4 MG/2ML IJ SOLN: 4.0000 mg | Freq: Four times a day (QID) | INTRAMUSCULAR | Status: DC | PRN

## 2023-12-20 MED ORDER — SENNOSIDES-DOCUSATE SODIUM 8.6-50 MG PO TABS
1.0000 | ORAL_TABLET | Freq: Every evening | ORAL | Status: DC | PRN
Start: 2023-12-20 — End: 2023-12-27
  Administered 2023-12-23 – 2023-12-25 (×2): 1 via ORAL
  Filled 2023-12-20 (×2): qty 1

## 2023-12-20 MED ORDER — PANTOPRAZOLE SODIUM 40 MG IV SOLR
40.0000 mg | Freq: Two times a day (BID) | INTRAVENOUS | Status: DC
  Administered 2023-12-21 – 2023-12-23 (×6): 40 mg via INTRAVENOUS
  Filled 2023-12-20 (×6): qty 10

## 2023-12-20 MED ORDER — ONDANSETRON HCL 4 MG PO TABS
4.0000 mg | ORAL_TABLET | Freq: Four times a day (QID) | ORAL | Status: DC | PRN
Start: 2023-12-20 — End: 2023-12-27

## 2023-12-20 NOTE — H&P (Addendum)
 Kevin Wilson is an 63 y.o. male.   Chief Complaint: Weakness HPI: 63 year old male with known history of metastatic prostate carcinoma.  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  Patient also reports a history of a known cervical disc herniation.  He has had difficulty with progressive weakness for the past 2 months which has become profound over the last 48 hours.  The patient is now nonambulatory with minimal movement of his lower extremities.  He has marked weakness in both hands.  He has reasonably good strength proximally in both upper extremities.  He has diminished sensation from a C6 level distally.  He is currently having no pain.  He is voiding well.  Patient has an MRI scan of his lumbar spine which demonstrates metastatic disease to his vertebral bodies and multiple lumbar vertebra and lower thoracic vertebra however he has no evidence of epidural tumor spread or severe spinal stenosis.  Patient was unable to tolerate attempts at MRI scanning of his cervical spine.  Past Medical History:  Diagnosis Date   Cancer [facility])     Past Surgical History:  Procedure Laterality Date   LYMPHADENECTOMY Bilateral 08/27/2019   Procedure: LYMPHADENECTOMY, PELVIC;  Surgeon: Florencio Hunting, MD;  Location: [facility] ORS;  Service: Urology;  Laterality: Bilateral;   NO PAST SURGERIES     ROBOT ASSISTED LAPAROSCOPIC RADICAL PROSTATECTOMY N/A 08/27/2019   Procedure: XI ROBOTIC ASSISTED LAPAROSCOPIC RADICAL PROSTATECTOMY LEVEL 2;  Surgeon: Florencio Hunting, MD;  Location: [facility] ORS;  Service: Urology;  Laterality: N/A;    History reviewed. No pertinent family history. Social History:  reports that he has never smoked. He has never used smokeless tobacco. He reports that he does not drink alcohol and does not use drugs.  Allergies: No Known Allergies  Medications Prior to Admission  Medication Sig Dispense Refill   sulfamethoxazole -trimethoprim  (BACTRIM  DS)  800-160 MG tablet Take 1 tablet by mouth 2 (two) times daily. Start the day prior to foley removal appointment 6 tablet 0   traMADol  (ULTRAM ) 50 MG tablet Take 1-2 tablets (50-100 mg total) by mouth every 6 (six) hours as needed for moderate pain or severe pain. 20 tablet 0    No results found for this or any previous visit (from the past 48 hours). No results found.  Review of systems not obtained due to patient factors.  There were no vitals taken for this visit.  The patient is awake and alert.  He is oriented and appropriate.  Speech is fluent.  Judgment insight are intact.  Cranial nerve function normal bilaterally.  Motor examination reveals 5/5 strength in his deltoid muscle groups bilaterally and biceps muscles bilaterally.  He has 3/5 strength in his wrist extensors bilaterally.  He has 3/5 strength in his triceps muscle on the right side and 2/5 strength in his triceps muscle on the left side.  He has absent grip strength bilaterally.  He has no intrinsic function bilaterally.  Lower extremities reveal some trace muscle contraction without movement in both lower extremities (1/5).  He has a sensory level at C6.  He is hyperreflexic in both lower extremities.  Toes are upgoing to plantar stimulation.  Examination head ears eyes nose throat is unremarked.  Chest and abdomen are benign.  He has a left subclavian port in place.  Extremities are free from injury deformity. Assessment/Plan Profound cervical myelopathy with increasing quadriparesis worrisome for severe spinal stenosis either from cervical disc disease or metastatic prostate carcinoma.  Patient  needs emergent MRI scan of his cervical spine.  Begin steroids.  Possible decompressive surgery depending on the results of his MRI scan.  Awilda Bogus Shamia Uppal 12/20/2023, 11:43 PM

## 2023-12-20 NOTE — Progress Notes (Addendum)
 eLink Physician-Brief Progress Note Patient Name: Kevin Wilson DOB: 14-Oct-1960 MRN: [id number]   Date of Service  12/20/2023  HPI/Events of Note  Brief new admit evaluation:  58 male s/p robotic asisted prostatectomy for prostate cancer in 2021, HTN admitted to ICU by Neurosurgery team for Cervical myelopathy: Profound cervical myelopathy with increasing quadriparesis worrisome for severe spinal stenosis either from cervical disc disease or metastatic prostate carcinoma. Patient needs emergent MRI scan of his cervical spine. Begin steroids. Possible decompressive surgery depending on the results of his MRI scan.  Camera: Tall man, VS stable, on room air. Awake and alert.  Data: Labs pending   eICU Interventions  -continue Neuro surgery plan of care. Follow labs. EKG/HIV.   - consider VTE prophylaxis if not going for surgery. For now can go on SCD.  CBG goals < 180 Avoid hypotension.      Intervention Category Major Interventions: Other: Evaluation Type: New Patient Evaluation  Jodelle ONEIDA Hutching 12/20/2023, 11:19 PM  05:16  SBP >160. Labetalol  given about an hour ago and SBP remains >160. RN asking about orders for Hydralazine  for 2nd line? - hydralazine  ordered.

## 2023-12-21 ENCOUNTER — Other Ambulatory Visit: Payer: Self-pay

## 2023-12-21 ENCOUNTER — Encounter (HOSPITAL_COMMUNITY)
Admission: EM | Disposition: A | Discharge status: Skilled Nursing Facility | Payer: Self-pay | Source: Home / Self Care | Attending: Neurosurgery

## 2023-12-21 ENCOUNTER — Inpatient Hospital Stay (HOSPITAL_COMMUNITY): Payer: Self-pay

## 2023-12-21 ENCOUNTER — Inpatient Hospital Stay (HOSPITAL_COMMUNITY): Payer: Self-pay | Admitting: Anesthesiology

## 2023-12-21 ENCOUNTER — Encounter (HOSPITAL_COMMUNITY): Payer: Self-pay | Admitting: Neurosurgery

## 2023-12-21 DIAGNOSIS — C412 Malignant neoplasm of vertebral column: Secondary | ICD-10-CM | POA: Diagnosis not present

## 2023-12-21 DIAGNOSIS — I10 Essential (primary) hypertension: Secondary | ICD-10-CM

## 2023-12-21 DIAGNOSIS — G959 Disease of spinal cord, unspecified: Secondary | ICD-10-CM | POA: Diagnosis not present

## 2023-12-21 HISTORY — PX: POSTERIOR CERVICAL LAMINECTOMY: SHX2248

## 2023-12-21 LAB — COMPREHENSIVE METABOLIC PANEL WITH GFR
ALT: 31 U/L (ref 0–44)
AST: 30 U/L (ref 15–41)
Albumin: 2.9 g/dL — ABNORMAL LOW (ref 3.5–5.0)
Alkaline Phosphatase: 63 U/L (ref 38–126)
Anion gap: 11 (ref 5–15)
BUN: 33 mg/dL — ABNORMAL HIGH (ref 8–23)
CO2: 21 mmol/L — ABNORMAL LOW (ref 22–32)
Calcium: 8.8 mg/dL — ABNORMAL LOW (ref 8.9–10.3)
Chloride: 106 mmol/L (ref 98–111)
Creatinine, Ser: 1.62 mg/dL — ABNORMAL HIGH (ref 0.61–1.24)
GFR, Estimated: 47 mL/min — ABNORMAL LOW (ref >60)
Glucose, Bld: 142 mg/dL — ABNORMAL HIGH (ref 70–99)
Potassium: 4.4 mmol/L (ref 3.5–5.1)
Sodium: 138 mmol/L (ref 135–145)
Total Bilirubin: <0.2 mg/dL (ref 0.0–1.2)
Total Protein: 6.4 g/dL — ABNORMAL LOW (ref 6.5–8.1)

## 2023-12-21 LAB — URINALYSIS, ROUTINE W REFLEX MICROSCOPIC
Bilirubin Urine: NEGATIVE
Glucose, UA: NEGATIVE mg/dL
Ketones, ur: NEGATIVE mg/dL
Nitrite: NEGATIVE
Protein, ur: NEGATIVE mg/dL
Specific Gravity, Urine: 1.012 (ref 1.005–1.030)
WBC, UA: >50 WBC/hpf (ref 0–5)
pH: 6 (ref 5.0–8.0)

## 2023-12-21 LAB — HIV ANTIBODY (ROUTINE TESTING W REFLEX): HIV Screen 4th Generation wRfx: NONREACTIVE

## 2023-12-21 LAB — GLUCOSE, CAPILLARY
Glucose-Capillary: 169 mg/dL — ABNORMAL HIGH (ref 70–99)
Glucose-Capillary: 174 mg/dL — ABNORMAL HIGH (ref 70–99)

## 2023-12-21 LAB — PREPARE RBC (CROSSMATCH)

## 2023-12-21 LAB — CBC
HCT: 28.8 % — ABNORMAL LOW (ref 39.0–52.0)
Hemoglobin: 9 g/dL — ABNORMAL LOW (ref 13.0–17.0)
MCH: 33.3 pg (ref 26.0–34.0)
MCHC: 31.3 g/dL (ref 30.0–36.0)
MCV: 106.7 fL — ABNORMAL HIGH (ref 80.0–100.0)
Platelets: 380 K/uL (ref 150–400)
RBC: 2.7 MIL/uL — ABNORMAL LOW (ref 4.22–5.81)
RDW: 14.7 % (ref 11.5–15.5)
WBC: 8.4 K/uL (ref 4.0–10.5)
nRBC: 0 % (ref 0.0–0.2)

## 2023-12-21 LAB — HEMOGLOBIN A1C
Hgb A1c MFr Bld: 5.8 % — ABNORMAL HIGH (ref 4.8–5.6)
Mean Plasma Glucose: 119.76 mg/dL

## 2023-12-21 LAB — SURGICAL PCR SCREEN
MRSA, PCR: NEGATIVE
Staphylococcus aureus: NEGATIVE

## 2023-12-21 SURGERY — POSTERIOR CERVICAL LAMINECTOMY
Anesthesia: General | Site: Neck

## 2023-12-21 MED ORDER — THROMBIN 5000 UNITS EX KIT
PACK | CUTANEOUS | Status: AC
Start: 2023-12-21 — End: 2023-12-21
  Filled 2023-12-21: qty 1

## 2023-12-21 MED ORDER — THROMBIN 5000 UNITS EX SOLR: OROMUCOSAL | Status: DC | PRN

## 2023-12-21 MED ORDER — MIDAZOLAM HCL 2 MG/2ML IJ SOLN
INTRAMUSCULAR | Status: AC
  Filled 2023-12-21: qty 2

## 2023-12-21 MED ORDER — CHLORHEXIDINE GLUCONATE 0.12 % MT SOLN
15.0000 mL | Freq: Once | OROMUCOSAL | Status: AC
  Administered 2023-12-21: 15 mL via OROMUCOSAL

## 2023-12-21 MED ORDER — PHENYLEPHRINE HCL-NACL 20-0.9 MG/250ML-% IV SOLN
INTRAVENOUS | Status: DC | PRN
  Administered 2023-12-21: 50 ug/min via INTRAVENOUS

## 2023-12-21 MED ORDER — DEXAMETHASONE SODIUM PHOSPHATE 10 MG/ML IJ SOLN
INTRAMUSCULAR | Status: AC
  Filled 2023-12-21: qty 1

## 2023-12-21 MED ORDER — INSULIN ASPART 100 UNIT/ML IJ SOLN
0.0000 [IU] | Freq: Every day | INTRAMUSCULAR | Status: DC
  Administered 2023-12-23: 3 [IU] via SUBCUTANEOUS
  Administered 2023-12-24: 4 [IU] via SUBCUTANEOUS
  Administered 2023-12-26: 2 [IU] via SUBCUTANEOUS

## 2023-12-21 MED ORDER — THROMBIN 20000 UNITS EX SOLR: CUTANEOUS | Status: DC | PRN

## 2023-12-21 MED ORDER — ROCURONIUM BROMIDE 10 MG/ML (PF) SYRINGE
PREFILLED_SYRINGE | INTRAVENOUS | Status: AC
  Filled 2023-12-21: qty 10

## 2023-12-21 MED ORDER — ALLOPURINOL 100 MG PO TABS
100.0000 mg | ORAL_TABLET | Freq: Two times a day (BID) | ORAL | Status: DC
  Administered 2023-12-21 – 2023-12-27 (×13): 100 mg via ORAL
  Filled 2023-12-21 (×14): qty 1

## 2023-12-21 MED ORDER — ACETAMINOPHEN 10 MG/ML IV SOLN: 1000.0000 mg | Freq: Once | INTRAVENOUS | Status: DC | PRN

## 2023-12-21 MED ORDER — FENTANYL CITRATE (PF) 250 MCG/5ML IJ SOLN
INTRAMUSCULAR | Status: DC | PRN
  Administered 2023-12-21: 50 ug via INTRAVENOUS
  Administered 2023-12-21: 100 ug via INTRAVENOUS
  Administered 2023-12-21 (×2): 50 ug via INTRAVENOUS

## 2023-12-21 MED ORDER — EPHEDRINE SULFATE-NACL 50-0.9 MG/10ML-% IV SOSY
PREFILLED_SYRINGE | INTRAVENOUS | Status: DC | PRN
  Administered 2023-12-21 (×2): 5 mg via INTRAVENOUS

## 2023-12-21 MED ORDER — DEXAMETHASONE SODIUM PHOSPHATE 10 MG/ML IJ SOLN
INTRAMUSCULAR | Status: DC | PRN
  Administered 2023-12-21: 5 mg via INTRAVENOUS

## 2023-12-21 MED ORDER — CEFAZOLIN SODIUM-DEXTROSE 2-4 GM/100ML-% IV SOLN
2.0000 g | INTRAVENOUS | Status: AC
  Administered 2023-12-21: 2 g via INTRAVENOUS
  Filled 2023-12-21: qty 100

## 2023-12-21 MED ORDER — HYDRALAZINE HCL 20 MG/ML IJ SOLN
INTRAMUSCULAR | Status: AC
Start: 2023-12-21 — End: 2023-12-21
  Filled 2023-12-21: qty 1

## 2023-12-21 MED ORDER — POLYETHYLENE GLYCOL 3350 17 G PO PACK
17.0000 g | PACK | Freq: Every day | ORAL | Status: DC
  Administered 2023-12-21 – 2023-12-27 (×7): 17 g via ORAL
  Filled 2023-12-21 (×7): qty 1

## 2023-12-21 MED ORDER — ORAL CARE MOUTH RINSE
15.0000 mL | Freq: Once | OROMUCOSAL | Status: AC
Start: 2023-12-21 — End: 2023-12-21

## 2023-12-21 MED ORDER — LABETALOL HCL 5 MG/ML IV SOLN
INTRAVENOUS | Status: AC
  Filled 2023-12-21: qty 4

## 2023-12-21 MED ORDER — SUGAMMADEX SODIUM 200 MG/2ML IV SOLN
INTRAVENOUS | Status: DC | PRN
  Administered 2023-12-21: 200 mg via INTRAVENOUS

## 2023-12-21 MED ORDER — INSULIN ASPART 100 UNIT/ML IJ SOLN
0.0000 [IU] | Freq: Three times a day (TID) | INTRAMUSCULAR | Status: DC
  Administered 2023-12-21 – 2023-12-22 (×2): 2 [IU] via SUBCUTANEOUS
  Administered 2023-12-22: 3 [IU] via SUBCUTANEOUS
  Administered 2023-12-22 – 2023-12-23 (×2): 2 [IU] via SUBCUTANEOUS
  Administered 2023-12-23: 1 [IU] via SUBCUTANEOUS
  Administered 2023-12-23: 3 [IU] via SUBCUTANEOUS
  Administered 2023-12-24: 2 [IU] via SUBCUTANEOUS
  Administered 2023-12-24: 1 [IU] via SUBCUTANEOUS
  Administered 2023-12-24: 3 [IU] via SUBCUTANEOUS
  Administered 2023-12-25: 1 [IU] via SUBCUTANEOUS
  Administered 2023-12-25 – 2023-12-26 (×3): 2 [IU] via SUBCUTANEOUS
  Administered 2023-12-26 – 2023-12-27 (×2): 3 [IU] via SUBCUTANEOUS
  Administered 2023-12-27: 2 [IU] via SUBCUTANEOUS

## 2023-12-21 MED ORDER — ONDANSETRON HCL 4 MG/2ML IJ SOLN
INTRAMUSCULAR | Status: AC
  Filled 2023-12-21: qty 2

## 2023-12-21 MED ORDER — MENTHOL 3 MG MT LOZG: 1.0000 | LOZENGE | OROMUCOSAL | Status: DC | PRN

## 2023-12-21 MED ORDER — BACITRACIN ZINC 500 UNIT/GM EX OINT
TOPICAL_OINTMENT | CUTANEOUS | Status: AC
  Filled 2023-12-21: qty 28.35

## 2023-12-21 MED ORDER — LIDOCAINE 2% (20 MG/ML) 5 ML SYRINGE
INTRAMUSCULAR | Status: DC | PRN
  Administered 2023-12-21: 100 mg via INTRAVENOUS

## 2023-12-21 MED ORDER — LABETALOL HCL 5 MG/ML IV SOLN
10.0000 mg | INTRAVENOUS | Status: DC | PRN
  Administered 2023-12-21 (×2): 10 mg via INTRAVENOUS
  Filled 2023-12-21 (×2): qty 4

## 2023-12-21 MED ORDER — HYDROMORPHONE HCL 1 MG/ML IJ SOLN
INTRAMUSCULAR | Status: AC
  Filled 2023-12-21: qty 0.5

## 2023-12-21 MED ORDER — SODIUM CHLORIDE 0.9% FLUSH: 3.0000 mL | INTRAVENOUS | Status: DC | PRN

## 2023-12-21 MED ORDER — ROCURONIUM BROMIDE 10 MG/ML (PF) SYRINGE
PREFILLED_SYRINGE | INTRAVENOUS | Status: DC | PRN
  Administered 2023-12-21: 70 mg via INTRAVENOUS
  Administered 2023-12-21 (×3): 20 mg via INTRAVENOUS

## 2023-12-21 MED ORDER — ACETAMINOPHEN 10 MG/ML IV SOLN
INTRAVENOUS | Status: DC | PRN
  Administered 2023-12-21: 1000 mg via INTRAVENOUS

## 2023-12-21 MED ORDER — THROMBIN 20000 UNITS EX SOLR
CUTANEOUS | Status: AC
  Filled 2023-12-21: qty 20000

## 2023-12-21 MED ORDER — AMLODIPINE BESYLATE 5 MG PO TABS
10.0000 mg | ORAL_TABLET | Freq: Every day | ORAL | Status: DC
  Administered 2023-12-21 – 2023-12-27 (×7): 10 mg via ORAL
  Filled 2023-12-21 (×3): qty 2
  Filled 2023-12-21 (×2): qty 1
  Filled 2023-12-21 (×2): qty 2

## 2023-12-21 MED ORDER — CHLORHEXIDINE GLUCONATE 0.12 % MT SOLN
OROMUCOSAL | Status: AC
  Filled 2023-12-21: qty 15

## 2023-12-21 MED ORDER — KETOROLAC TROMETHAMINE 15 MG/ML IJ SOLN
15.0000 mg | Freq: Four times a day (QID) | INTRAMUSCULAR | Status: AC
  Administered 2023-12-21 – 2023-12-22 (×4): 15 mg via INTRAVENOUS
  Filled 2023-12-21 (×4): qty 1

## 2023-12-21 MED ORDER — VANCOMYCIN HCL 1000 MG IV SOLR
INTRAVENOUS | Status: AC
  Filled 2023-12-21: qty 20

## 2023-12-21 MED ORDER — PHENYLEPHRINE 80 MCG/ML (10ML) SYRINGE FOR IV PUSH (FOR BLOOD PRESSURE SUPPORT)
PREFILLED_SYRINGE | INTRAVENOUS | Status: DC | PRN
  Administered 2023-12-21 (×2): 160 ug via INTRAVENOUS
  Administered 2023-12-21: 200 ug via INTRAVENOUS
  Administered 2023-12-21: 160 ug via INTRAVENOUS

## 2023-12-21 MED ORDER — HYDRALAZINE HCL 20 MG/ML IJ SOLN
10.0000 mg | Freq: Once | INTRAMUSCULAR | Status: AC
  Administered 2023-12-21: 10 mg via INTRAVENOUS

## 2023-12-21 MED ORDER — HYDROMORPHONE HCL 1 MG/ML IJ SOLN
INTRAMUSCULAR | Status: DC | PRN
  Administered 2023-12-21: .25 mg via INTRAVENOUS

## 2023-12-21 MED ORDER — HEMOSTATIC AGENTS (NO CHARGE) OPTIME
TOPICAL | Status: DC | PRN
  Administered 2023-12-21: 1 via TOPICAL

## 2023-12-21 MED ORDER — DROPERIDOL 2.5 MG/ML IJ SOLN
0.6250 mg | Freq: Once | INTRAMUSCULAR | Status: DC | PRN
Start: 2023-12-21 — End: 2023-12-21

## 2023-12-21 MED ORDER — SODIUM CHLORIDE 0.9 % IV SOLN: 10.0000 mL/h | Freq: Once | INTRAVENOUS | Status: AC

## 2023-12-21 MED ORDER — BUPIVACAINE HCL (PF) 0.25 % IJ SOLN
INTRAMUSCULAR | Status: AC
  Filled 2023-12-21: qty 30

## 2023-12-21 MED ORDER — PROPOFOL 10 MG/ML IV BOLUS
INTRAVENOUS | Status: AC
Start: 2023-12-21 — End: 2023-12-21
  Filled 2023-12-21: qty 20

## 2023-12-21 MED ORDER — FENTANYL CITRATE (PF) 250 MCG/5ML IJ SOLN
INTRAMUSCULAR | Status: AC
  Filled 2023-12-21: qty 5

## 2023-12-21 MED ORDER — FENTANYL CITRATE (PF) 100 MCG/2ML IJ SOLN
25.0000 ug | INTRAMUSCULAR | Status: DC | PRN
  Administered 2023-12-21 (×2): 25 ug via INTRAVENOUS

## 2023-12-21 MED ORDER — FENTANYL CITRATE (PF) 100 MCG/2ML IJ SOLN
INTRAMUSCULAR | Status: AC
  Filled 2023-12-21: qty 2

## 2023-12-21 MED ORDER — PHENYLEPHRINE 80 MCG/ML (10ML) SYRINGE FOR IV PUSH (FOR BLOOD PRESSURE SUPPORT)
PREFILLED_SYRINGE | INTRAVENOUS | Status: AC
  Filled 2023-12-21: qty 20

## 2023-12-21 MED ORDER — SODIUM CHLORIDE 0.9% FLUSH
3.0000 mL | Freq: Two times a day (BID) | INTRAVENOUS | Status: DC
  Administered 2023-12-21 – 2023-12-26 (×9): 3 mL via INTRAVENOUS

## 2023-12-21 MED ORDER — NICARDIPINE HCL IN NACL 20-0.86 MG/200ML-% IV SOLN
3.0000 mg/h | INTRAVENOUS | Status: DC
  Administered 2023-12-21: 5 mg/h via INTRAVENOUS
  Filled 2023-12-21: qty 200

## 2023-12-21 MED ORDER — PHENOL 1.4 % MT LIQD: 1.0000 | OROMUCOSAL | Status: DC | PRN

## 2023-12-21 MED ORDER — EPHEDRINE 5 MG/ML INJ
INTRAVENOUS | Status: AC
  Filled 2023-12-21: qty 5

## 2023-12-21 MED ORDER — LACTATED RINGERS IV SOLN: INTRAVENOUS | Status: DC

## 2023-12-21 MED ORDER — BACITRACIN ZINC 500 UNIT/GM EX OINT
TOPICAL_OINTMENT | CUTANEOUS | Status: DC | PRN
Start: 2023-12-21 — End: 2023-12-21
  Administered 2023-12-21: 1 via TOPICAL

## 2023-12-21 MED ORDER — MIDAZOLAM HCL 2 MG/2ML IJ SOLN
INTRAMUSCULAR | Status: DC | PRN
  Administered 2023-12-21: 2 mg via INTRAVENOUS

## 2023-12-21 MED ORDER — GADOBUTROL 1 MMOL/ML IV SOLN
9.0000 mL | Freq: Once | INTRAVENOUS | Status: AC | PRN
  Administered 2023-12-21: 9 mL via INTRAVENOUS

## 2023-12-21 MED ORDER — SODIUM CHLORIDE 0.9 % IV SOLN: 250.0000 mL | INTRAVENOUS | Status: AC

## 2023-12-21 MED ORDER — LABETALOL HCL 200 MG PO TABS
200.0000 mg | ORAL_TABLET | Freq: Two times a day (BID) | ORAL | Status: DC
  Administered 2023-12-21 – 2023-12-27 (×13): 200 mg via ORAL
  Filled 2023-12-21 (×2): qty 1
  Filled 2023-12-21: qty 2
  Filled 2023-12-21 (×7): qty 1
  Filled 2023-12-21: qty 2
  Filled 2023-12-21 (×4): qty 1
  Filled 2023-12-21: qty 2
  Filled 2023-12-21: qty 1

## 2023-12-21 MED ORDER — HYDRALAZINE HCL 20 MG/ML IJ SOLN
5.0000 mg | INTRAMUSCULAR | Status: AC | PRN
  Administered 2023-12-21 (×2): 5 mg via INTRAVENOUS
  Filled 2023-12-21 (×2): qty 1

## 2023-12-21 MED ORDER — OXYCODONE HCL 5 MG PO TABS: 5.0000 mg | ORAL_TABLET | Freq: Once | ORAL | Status: DC | PRN

## 2023-12-21 MED ORDER — VANCOMYCIN HCL 1000 MG IV SOLR
INTRAVENOUS | Status: DC | PRN
  Administered 2023-12-21: 1000 mg

## 2023-12-21 MED ORDER — CEFAZOLIN SODIUM-DEXTROSE 1-4 GM/50ML-% IV SOLN
1.0000 g | Freq: Three times a day (TID) | INTRAVENOUS | Status: AC
  Administered 2023-12-21 (×2): 1 g via INTRAVENOUS
  Filled 2023-12-21 (×2): qty 50

## 2023-12-21 MED ORDER — OXYCODONE HCL 5 MG/5ML PO SOLN: 5.0000 mg | Freq: Once | ORAL | Status: DC | PRN

## 2023-12-21 MED ORDER — LABETALOL HCL 5 MG/ML IV SOLN
INTRAVENOUS | Status: DC | PRN
  Administered 2023-12-21 (×3): 5 mg via INTRAVENOUS

## 2023-12-21 MED ORDER — 0.9 % SODIUM CHLORIDE (POUR BTL) OPTIME
TOPICAL | Status: DC | PRN
  Administered 2023-12-21: 1000 mL

## 2023-12-21 MED ORDER — PROPOFOL 10 MG/ML IV BOLUS
INTRAVENOUS | Status: DC | PRN
  Administered 2023-12-21: 150 mg via INTRAVENOUS
  Administered 2023-12-21: 50 mg via INTRAVENOUS

## 2023-12-21 MED ORDER — LIDOCAINE 2% (20 MG/ML) 5 ML SYRINGE
INTRAMUSCULAR | Status: AC
Start: 2023-12-21 — End: 2023-12-21
  Filled 2023-12-21: qty 5

## 2023-12-21 SURGICAL SUPPLY — 43 items
BAG COUNTER SPONGE SURGICOUNT (BAG) ×1 IMPLANT
BAND RUBBER #18 3X1/16 STRL (MISCELLANEOUS) ×2 IMPLANT
BENZOIN TINCTURE PRP APPL 2/3 (GAUZE/BANDAGES/DRESSINGS) ×1 IMPLANT
BLADE CLIPPER SURG (BLADE) IMPLANT
BUR MATCHSTICK NEURO 3.0 LAGG (BURR) ×1 IMPLANT
CANISTER SUCTION 3000ML PPV (SUCTIONS) ×1 IMPLANT
CNTNR URN SCR LID CUP LEK RST (MISCELLANEOUS) IMPLANT
DERMABOND ADVANCED .7 DNX12 (GAUZE/BANDAGES/DRESSINGS) ×1 IMPLANT
DRAPE LAPAROTOMY 100X72 PEDS (DRAPES) ×1 IMPLANT
DRAPE MICROSCOPE SLANT 54X150 (MISCELLANEOUS) IMPLANT
DRSG OPSITE POSTOP 4X6 (GAUZE/BANDAGES/DRESSINGS) IMPLANT
DURAPREP 26ML APPLICATOR (WOUND CARE) ×1 IMPLANT
ELECTRODE BLDE 4.0 EZ CLN MEGD (MISCELLANEOUS) IMPLANT
ELECTRODE REM PT RTRN 9FT ADLT (ELECTROSURGICAL) ×1 IMPLANT
GAUZE 4X4 16PLY ~~LOC~~+RFID DBL (SPONGE) IMPLANT
GAUZE SPONGE 4X4 12PLY STRL (GAUZE/BANDAGES/DRESSINGS) ×1 IMPLANT
GLOVE BIO SURGEON STRL SZ7 (GLOVE) IMPLANT
GLOVE ECLIPSE 9.0 STRL (GLOVE) ×1 IMPLANT
GLOVE EXAM NITRILE XL STR (GLOVE) IMPLANT
GOWN STRL REUS W/ TWL LRG LVL3 (GOWN DISPOSABLE) IMPLANT
GOWN STRL REUS W/ TWL XL LVL3 (GOWN DISPOSABLE) ×1 IMPLANT
GOWN STRL REUS W/TWL 2XL LVL3 (GOWN DISPOSABLE) IMPLANT
KIT BASIN OR (CUSTOM PROCEDURE TRAY) ×1 IMPLANT
KIT TURNOVER KIT B (KITS) ×1 IMPLANT
NDL HYPO 22X1.5 SAFETY MO (MISCELLANEOUS) ×1 IMPLANT
NDL SPNL 22GX3.5 QUINCKE BK (NEEDLE) ×1 IMPLANT
NEEDLE HYPO 22X1.5 SAFETY MO (MISCELLANEOUS) ×1 IMPLANT
NEEDLE SPNL 22GX3.5 QUINCKE BK (NEEDLE) ×1 IMPLANT
NS IRRIG 1000ML POUR BTL (IV SOLUTION) ×1 IMPLANT
PACK LAMINECTOMY NEURO (CUSTOM PROCEDURE TRAY) ×1 IMPLANT
PAD ARMBOARD POSITIONER FOAM (MISCELLANEOUS) ×3 IMPLANT
PENCIL BUTTON HOLSTER BLD 10FT (ELECTRODE) IMPLANT
PIN MAYFIELD SKULL DISP (PIN) ×1 IMPLANT
SPONGE SURGIFOAM ABS GEL SZ50 (HEMOSTASIS) ×1 IMPLANT
SPONGE T-LAP 4X18 ~~LOC~~+RFID (SPONGE) IMPLANT
STRIP CLOSURE SKIN 1/2X4 (GAUZE/BANDAGES/DRESSINGS) ×1 IMPLANT
SUT VIC AB 0 CT1 18XCR BRD8 (SUTURE) ×1 IMPLANT
SUT VIC AB 2-0 CT1 18 (SUTURE) ×1 IMPLANT
SUT VIC AB 3-0 SH 8-18 (SUTURE) ×1 IMPLANT
TOWEL GREEN STERILE (TOWEL DISPOSABLE) ×1 IMPLANT
TOWEL GREEN STERILE FF (TOWEL DISPOSABLE) ×1 IMPLANT
TUBE CONNECTING 12X1/4 (SUCTIONS) IMPLANT
WATER STERILE IRR 1000ML POUR (IV SOLUTION) ×1 IMPLANT

## 2023-12-21 NOTE — Transfer of Care (Signed)
 Immediate Anesthesia Transfer of Care Note  Patient: Kevin Wilson  Procedure(s) Performed: POSTERIOR CERVICAL LAMINECTOMY (Neck)  Patient Location: PACU  Anesthesia Type:General  Level of Consciousness: awake and alert   Airway & Oxygen Therapy: Patient Spontanous Breathing and Patient connected to face mask oxygen  Post-op Assessment: Report given to RN and Post -op Vital signs reviewed and stable  Post vital signs: Reviewed and stable  Last Vitals:  Vitals Value Taken Time  BP 184/94 12/21/23 11:15  Temp 36.4 C 12/21/23 11:15  Pulse 72 12/21/23 11:25  Resp 13 12/21/23 11:25  SpO2 96 % 12/21/23 11:25  Vitals shown include unfiled device data.  Last Pain:  Vitals:   12/21/23 0757  TempSrc: Oral  PainSc: 5          Complications: No notable events documented.

## 2023-12-21 NOTE — Anesthesia Postprocedure Evaluation (Signed)
 Anesthesia Post Note  Patient: Riad Wagley  Procedure(s) Performed: POSTERIOR CERVICAL LAMINECTOMY (Neck)     Patient location during evaluation: PACU Anesthesia Type: General Level of consciousness: awake and alert Pain management: pain level controlled Vital Signs Assessment: post-procedure vital signs reviewed and stable Respiratory status: spontaneous breathing, nonlabored ventilation, respiratory function stable and patient connected to nasal cannula oxygen Cardiovascular status: blood pressure returned to baseline and stable Postop Assessment: no apparent nausea or vomiting Anesthetic complications: no   No notable events documented.  Last Vitals:  Vitals:   12/21/23 1215 12/21/23 1316  BP: (!) 149/80 (!) 191/80  Pulse: 78   Resp: 10   Temp: (!) 36.4 C   SpO2: 98%     Last Pain:  Vitals:   12/21/23 1316  TempSrc:   PainSc: 7                  Thom JONELLE Peoples

## 2023-12-21 NOTE — Op Note (Signed)
 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  Asst. Surgeon: Jennetta, NP  Anesthesia: General  Indication: 63 year old male with known metastatic prostate carcinoma presents with progressive quadriparesis.  Workup demonstrates evidence of significant metastatic epidural tumor within his cervical canal causing marked compression of his cervical spinal cord worse at the C6-7 level but extending both cranially and caudally.  Given the patient's declining neurologic status and ongoing cord compression I have recommended decompressive surgery with resection of epidural tumor prior to radiation therapy.  The patient is aware of the risks and benefits and wishes to proceed.  Operative note: After induction of anesthesia, patient positioned prone onto bolsters with his head fixed in [place] pin headrest.  Patient's posterior cervical region prepped and draped sterilely.  Incision made from C5-T2.  Dissection performed bilaterally.  Retractor placed.  X-ray taken.  C5-6 level was confirmed.  Decompressive laminectomy was then performed using Leksell rongeurs, Kerrison rongeurs and the high-speed drill to remove the inferior aspect of the lamina of L5 the entire lamina of C6, C7 and T1 and the superior aspect of the lamina of T2.  Ligament flavum elevated and resected.  Medial facetectomies were performed bilaterally.  Thecal sac with densely adherent epidural tumor was identified.  Microscope was brought in field used microdissection of the the spinal canal.  A plane was developed between the circumferential epidural tumor and the underlying thecal sac.  This was carefully dissected free and the tumor was gently resected from  the surrounding thecal sac.  Decompression proceeded into the lateral gutters bilaterally.  Foraminotomies were completed on the course exiting C6 and C7 nerve roots further resecting tumor.  Ventral epidural tumor was not approached.  At this point a very thorough decompression had been achieved.  There was no evidence of injury to the thecal sac or nerve roots or spinal cord.  Wound was then irrigated.  Gelfoam was placed topically for hemostasis.  Vancomycin  powder was placed in deep wound space.  Medium Hemovac drain was left in the deep wound space.  Wounds then closed in layers with Vicryl sutures.  Steri-Strips and sterile dressing were applied.  No apparent complications.  Patient tolerated the procedure well and he returns to the recovery room postop.

## 2023-12-21 NOTE — Anesthesia Procedure Notes (Signed)
 Arterial Line Insertion Start/End6/21/2025 7:50 AM, 12/21/2023 7:55 AM Performed by: Erma Thom SAUNDERS, MD, anesthesiologist  Patient location: Pre-op. Preanesthetic checklist: patient identified, IV checked, site marked, risks and benefits discussed, surgical consent, monitors and equipment checked, pre-op evaluation, timeout performed and anesthesia consent Lidocaine  1% used for infiltration Right, radial was placed Catheter size: 20 G Hand hygiene performed  and maximum sterile barriers used   Attempts: 1 Procedure performed using ultrasound guided technique. Ultrasound Notes:anatomy identified, needle tip was noted to be adjacent to the nerve/plexus identified and no ultrasound evidence of intravascular and/or intraneural injection Following insertion, dressing applied. Post procedure assessment: normal and unchanged  Patient tolerated the procedure well with no immediate complications.

## 2023-12-21 NOTE — Progress Notes (Signed)
 Attending:    Subjective: 31M with metastatic prostate cancer to the spine and hypertension who is admitted with progressive lower extremity weakness over the past months found to have severe cervical and thoracic stenosis s/p cervical and thoracic laminectomies for decompression.  Patient is without complaint, alert and conversating. Daughter is at bedside. Last bowel movement was Wednesday. No alcohol use.  Objective: Vitals:   12/21/23 1330 12/21/23 1400 12/21/23 1430 12/21/23 1435  BP: (!) 140/87 120/65 (!) 88/60 103/62  Pulse: 79 78 69 70  Resp: 10 (!) 21 10 (!) 9  Temp:      TempSrc:      SpO2: 97% 97% 95% 96%  Weight:      Height:          Intake/Output Summary (Last 24 hours) at 12/21/2023 1528 Last data filed at 12/21/2023 1434 Gross per 24 hour  Intake 1564.94 ml  Output 3075 ml  Net -1510.06 ml    General: no acute distress, resting in bed HEENT: [place]/AT, moist mucous membranes, sclera anicteric Neuro: A&O x 3, weakness of upper extremities CV: rrr, s1s2, no murmurs PULM: clear to auscultation bilaterally. No wheezing GI: soft, non-tender, non-distended, BS+ Extremities: warm, no edema Skin: no rashes    CBC    Component Value Date/Time   WBC 8.4 12/21/2023 0214   RBC 2.70 (L) 12/21/2023 0214   HGB 9.0 (L) 12/21/2023 0214   HCT 28.8 (L) 12/21/2023 0214   PLT 380 12/21/2023 0214   MCV 106.7 (H) 12/21/2023 0214   MCH 33.3 12/21/2023 0214   MCHC 31.3 12/21/2023 0214   RDW 14.7 12/21/2023 0214    BMET    Component Value Date/Time   NA 138 12/21/2023 0214   K 4.4 12/21/2023 0214   CL 106 12/21/2023 0214   CO2 21 (L) 12/21/2023 0214   GLUCOSE 142 (H) 12/21/2023 0214   BUN 33 (H) 12/21/2023 0214   CREATININE 1.62 (H) 12/21/2023 0214   CALCIUM 8.8 (L) 12/21/2023 0214   GFRNONAA 47 (L) 12/21/2023 0214   GFRAA >60 08/24/2019 1324   Impression/Plan: I agree with the impression and plan as outlined by Laurea Gleason, PA with emphasis on the  following:  Patient is critically ill in setting of severe cervical and thoracic spine stenosis due to metastatic prostate cancer s/p decompressive cervical/thoracic laminectomies by neurosurgery. Patient started on cardene  drip post op for hypertension. Resumed home meds and will try to wean off drip. He is on decadron  to reduce cord edema. Continue PPI bid as prophylaxis for high dose steroids. Start miralax  for bowel regimen as he is on narcotics for pain control. SSI for blood sugar control with being on steroids.  My cc time 35 minutes  Dorn Chill, MD [facility] Office: [phone]   See Amion for personal pager PCCM on call pager [phone] until 7pm. Please call Elink 7p-7a. [phone]

## 2023-12-21 NOTE — Anesthesia Procedure Notes (Signed)
 Procedure Name: Intubation Date/Time: 12/21/2023 8:30 AM  Performed by: Farley Crooker J, CRNAPre-anesthesia Checklist: Patient identified, Emergency Drugs available, Suction available and Patient being monitored Patient Re-evaluated:Patient Re-evaluated prior to induction Oxygen Delivery Method: Circle System Utilized Preoxygenation: Pre-oxygenation with 100% oxygen Induction Type: IV induction Ventilation: Mask ventilation without difficulty Laryngoscope Size: Glidescope and 4 Grade View: Grade I Tube type: Oral Tube size: 8.0 mm Number of attempts: 1 Airway Equipment and Method: Stylet and Oral airway Placement Confirmation: ETT inserted through vocal cords under direct vision, positive ETCO2 and breath sounds checked- equal and bilateral Secured at: 25 cm Tube secured with: Tape Dental Injury: Teeth and Oropharynx as per pre-operative assessment

## 2023-12-21 NOTE — Plan of Care (Signed)
  Problem: Pain Managment: Goal: General experience of comfort will improve and/or be controlled 12/21/2023 0956 by Gladis Darryle BRAVO, RN Outcome: Progressing 12/21/2023 0955 by Gladis Darryle BRAVO, RN Outcome: Progressing   Problem: Safety: Goal: Ability to remain free from injury will improve 12/21/2023 0956 by Gladis Darryle BRAVO, RN Outcome: Progressing 12/21/2023 0955 by Gladis Darryle BRAVO, RN Outcome: Progressing   Problem: Skin Integrity: Goal: Risk for impaired skin integrity will decrease 12/21/2023 0956 by Gladis Darryle BRAVO, RN Outcome: Progressing 12/21/2023 0955 by Gladis Darryle BRAVO, RN Outcome: Progressing   Problem: Activity: Goal: Ability to perform activities at highest level will improve Outcome: Progressing Goal: Muscle strength will improve Outcome: Progressing   Problem: Education: Goal: Knowledge of disease or condition will improve Outcome: Progressing Goal: Knowledge of the prescribed therapeutic regimen will improve Outcome: Progressing   Problem: Coping: Goal: Ability to identify and develop effective coping behavior will improve Outcome: Progressing Goal: Ability to verbalize feelings will improve Outcome: Progressing   Problem: Self-Care: Goal: Ability to participate in self-care as condition permits will improve Outcome: Progressing   Problem: Skin Integrity: Goal: Risk for impaired skin integrity will decrease Outcome: Progressing   Problem: Urinary Elimination: Goal: Ability to achieve a regular elimination pattern will improve Outcome: Progressing

## 2023-12-21 NOTE — Brief Op Note (Signed)
 12/20/2023 - 12/21/2023  10:43 AM  PATIENT:  Kevin Wilson  63 y.o. male  PRE-OPERATIVE DIAGNOSIS:  CERVICAL TUMOR  POST-OPERATIVE DIAGNOSIS:  CERVICAL TUMOR  PROCEDURE:  Procedure(s) with comments: POSTERIOR CERVICAL LAMINECTOMY (N/A) - Cervical Six - Throacic Two Decompressive Laminectomy With Resection Of Wpidural Tumor  SURGEON:  Surgeons and Role:    DEWAINE Louis Shove, MD - Primary  PHYSICIAN ASSISTANT:   ASSISTANTSBETHA Jennetta PIETY   ANESTHESIA:   general  EBL:  150 mL   BLOOD ADMINISTERED:none  DRAINS: (med) Hemovact drain(s) in the deep wound space with  Suction Open   LOCAL MEDICATIONS USED:  NONE  SPECIMEN:  Source of Specimen:  Cervical epidural tumor  DISPOSITION OF SPECIMEN:  PATHOLOGY  COUNTS:  YES  TOURNIQUET:  * No tourniquets in log *  DICTATION: .Dragon Dictation  PLAN OF CARE: Admit to inpatient   PATIENT DISPOSITION:  PACU - hemodynamically stable.   Delay start of Pharmacological VTE agent (>24hrs) due to surgical blood loss or risk of bleeding: yes

## 2023-12-21 NOTE — Interval H&P Note (Signed)
 History and Physical Interval Note:  12/21/2023 7:54 AM  Kevin Wilson  has presented today for surgery, with the diagnosis of CERVICAL TUMOR.  The various methods of treatment have been discussed with the patient and family. After consideration of risks, benefits and other options for treatment, the patient has consented to  Procedure(s) with comments: POSTERIOR CERVICAL LAMINECTOMY (N/A) - C6 - T2 DECOMPRESSION LAMINECTOMY as a surgical intervention.  The patient's history has been reviewed, patient examined, no change in status, stable for surgery.  I have reviewed the patient's chart and labs.  Questions were answered to the patient's satisfaction.     [employer] Tinnie Kunin

## 2023-12-21 NOTE — Progress Notes (Signed)
 MRI scan performed earlier this morning demonstrates evidence of metastatic spread of his prostate carcinoma to his cervical spine with circumferential epidural tumor predominantly at C6-7 but extending down his upper thoracic spine.  There are no focal areas of large tumor deposit but there does appear to be significant circumferential constriction of the spinal cord.  No evidence of pathologic fracture or gross instability.  Overall alignment is reasonably normal.  On exam the patient is slightly improved from where he was earlier this morning.  He has a little bit more grip strength and some minimal intrinsic function in both hands.  He now has 2/5 strength in both lower extremities.  Of discussed situation with the patient.  He has profound quadriparesis with significant epidural tumor and ongoing cord compression.  I think his best chance of improvement is with cervical thoracic decompressive surgery with resection of his epidural tumor.  I have discussed the risks involved with surgery including but not limited to the risk of anesthesia, bleeding, infection, CSF leak, nerve root injury, spinal cord injury, later instability, none benefit, tumor regrowth, need for adjunctive therapies, and continued pain.  The patient has been given the obvious questions.  Appears to understand.  He wishes to proceed with surgery.

## 2023-12-21 NOTE — Anesthesia Preprocedure Evaluation (Signed)
 Anesthesia Evaluation  Patient identified by MRN, date of birth, ID band Patient awake    Reviewed: Allergy & Precautions, H&P , NPO status , Patient's Chart, lab work & pertinent test results  Airway Mallampati: II  TM Distance: >3 FB Neck ROM: Full    Dental no notable dental hx.    Pulmonary neg pulmonary ROS   Pulmonary exam normal breath sounds clear to auscultation       Cardiovascular hypertension, (-) Past MI Normal cardiovascular exam+ Valvular Problems/Murmurs AS  Rhythm:Regular Rate:Normal  TTE 2022: Summary   1. Overall left ventricular ejection fraction is estimated at 55 to 60%.   2. Normal global left ventricular systolic function.   3. Normal left ventricular diastolic filling.   4. Mild mitral annular calcification.   5. Mild aortic stenosis.     Neuro/Psych Cervical myelopathy   negative psych ROS   GI/Hepatic negative GI ROS, Neg liver ROS,,,  Endo/Other  negative endocrine ROS    Renal/GU Renal InsufficiencyRenal disease   Prostate cance    Musculoskeletal negative musculoskeletal ROS (+)    Abdominal   Peds negative pediatric ROS (+)  Hematology negative hematology ROS (+)   Anesthesia Other Findings Prostate cancer with bone mets  Reproductive/Obstetrics negative OB ROS                              Anesthesia Physical Anesthesia Plan  ASA: 3  Anesthesia Plan: General   Post-op Pain Management: Ofirmev  IV (intra-op)*   Induction: Intravenous  PONV Risk Score and Plan: 3 and Ondansetron , Dexamethasone , Treatment may vary due to age or medical condition and Midazolam   Airway Management Planned: Oral ETT and Video Laryngoscope Planned  Additional Equipment: Arterial line  Intra-op Plan:   Post-operative Plan: Extubation in OR  Informed Consent: I have reviewed the patients History and Physical, chart, labs and discussed the procedure including the  risks, benefits and alternatives for the proposed anesthesia with the patient or authorized representative who has indicated his/her understanding and acceptance.     Dental advisory given  Plan Discussed with: CRNA  Anesthesia Plan Comments:          Anesthesia Quick Evaluation

## 2023-12-21 NOTE — Progress Notes (Signed)
 NAME:  Kevin Wilson, MRN:  [id number], DOB:  05-24-1961, LOS: 1 ADMISSION DATE:  12/20/2023, CONSULTATION DATE:  12/21/23 REFERRING MD:  Louis, CHIEF COMPLAINT:  weakness   History of Present Illness:  Kevin Wilson is a 63 y.o. M with PMH significant for metastatic prostate Ca with mets to the spine and bilateral shoulders, HTN who has noted progressively worsening LE weakness over the last two months that rapidly worsened over the last week to the point that he can no longer ambulate.  He was taken to the ER at [facility] and MRI revealed diffuse metastatic disease throughout the lower cervical and upper thoracic spine with effacement of the thecal sac and mass effect on the spinal cord.  He was seen by neurosurgery with a plan for cervical decompression in the morning.   Pertinent  Medical History   has a past medical history of Cancer (HCC).   Significant Hospital Events: Including procedures, antibiotic start and stop dates in addition to other pertinent events   6/21 admit for cervical decompression  Interim History / Subjective:  Pt is alert and stable without complaints   Objective    Blood pressure (!) 187/95, pulse 77, temperature 97.7 F (36.5 C), temperature source Oral, resp. rate 10, height 5' 8 (1.727 m), weight 91.1 kg, SpO2 99%.        Intake/Output Summary (Last 24 hours) at 12/21/2023 0407 Last data filed at 12/21/2023 0300 Gross per 24 hour  Intake 134.78 ml  Output 825 ml  Net -690.22 ml   Filed Weights   12/20/23 2325  Weight: 91.1 kg   General:  well nourished M resting in bed in NAD HEENT: MM pink/moist, sclera anicteric  Neuro: alert and oriented, 1/5 strength in both lower extremities, 4/5 proximal upper extremity strength with decreased sensation in all extremities CV: s1s2 rrr, no m/r/g PULM:  clear bilaterally on RA GI: soft, non-tender  Extremities: warm/dry, no edema  Skin: no rashes or lesions   Resolved problem list   Assessment and  Plan    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 HTN History of metastatic prostate Ca  -plan for cervical decompression in the OR later today -continue dexamethasone  -prn Labetalol  to maintain SBP <160 -resume home scheduled labetalol  and norvasc  when able to take po's -supportive care-maintain normothermia, euglycemia, normoxia   Best Practice (right click and Reselect all SmartList Selections daily)   Diet/type: NPO DVT prophylaxis SCD Pressure ulcer(s): N/A GI prophylaxis: N/A Lines: N/A Foley:  N/A Code Status:  DNR Last date of multidisciplinary goals of care discussion [pending ]  Labs   CBC: Recent Labs  Lab 12/21/23 0214  WBC 8.4  HGB 9.0*  HCT 28.8*  MCV 106.7*  PLT 380    Basic Metabolic Panel: Recent Labs  Lab 12/21/23 0214  NA 138  K 4.4  CL 106  CO2 21*  GLUCOSE 142*  BUN 33*  CREATININE 1.62*  CALCIUM 8.8*   GFR: Estimated Creatinine Clearance: 50.5 mL/min (A) (by C-G formula based on SCr of 1.62 mg/dL (H)). Recent Labs  Lab 12/21/23 0214  WBC 8.4    Liver Function Tests: Recent Labs  Lab 12/21/23 0214  AST 30  ALT 31  ALKPHOS 63  BILITOT <0.2  PROT 6.4*  ALBUMIN 2.9*   No results for input(s): LIPASE, AMYLASE in the last 168 hours. No results for input(s): AMMONIA in the last 168 hours.  ABG No results found for: PHART, PCO2ART, PO2ART, HCO3, TCO2, ACIDBASEDEF,  O2SAT   Coagulation Profile: No results for input(s): INR, PROTIME in the last 168 hours.  Cardiac Enzymes: No results for input(s): CKTOTAL, CKMB, CKMBINDEX, TROPONINI in the last 168 hours.  HbA1C: No results found for: HGBA1C  CBG: No results for input(s): GLUCAP in the last 168 hours.  Review of Systems:   Please see the history of present illness. All other systems reviewed and are negative    Past Medical History:  He,  has a past medical history of Cancer (HCC).   Surgical  History:   Past Surgical History:  Procedure Laterality Date   LYMPHADENECTOMY Bilateral 08/27/2019   Procedure: LYMPHADENECTOMY, PELVIC;  Surgeon: Renda Glance, MD;  Location: [facility];  Service: Urology;  Laterality: Bilateral;   NO PAST SURGERIES     ROBOT ASSISTED LAPAROSCOPIC RADICAL PROSTATECTOMY N/A 08/27/2019   Procedure: XI ROBOTIC ASSISTED LAPAROSCOPIC RADICAL PROSTATECTOMY LEVEL 2;  Surgeon: Renda Glance, MD;  Location: [facility];  Service: Urology;  Laterality: N/A;     Social History:   reports that he has never smoked. He has never used smokeless tobacco. He reports that he does not drink alcohol and does not use drugs.   Family History:  His family history is not on file.   Allergies Allergies  Allergen Reactions   Tramadol  Itching and Other (See Comments)    Itching and hallucinations     Home Medications  Prior to Admission medications   Medication Sig Start Date End Date Taking? Authorizing Provider  allopurinol  (ZYLOPRIM ) 100 MG tablet Take 100 mg by mouth 2 (two) times daily.   Yes [provider]  amLODipine  (NORVASC ) 10 MG tablet Take 10 mg by mouth daily.   Yes [provider]  labetalol  (NORMODYNE ) 200 MG tablet Take 200 mg by mouth 2 (two) times daily.   Yes [provider]  oxyCODONE  (OXY IR/ROXICODONE ) 5 MG immediate release tablet Take 5 mg by mouth every 6 (six) hours as needed for severe pain (pain score 7-10).   Yes [provider]     Critical care time:      Leita SAUNDERS Jacoby Zanni, PA-C [facility] See Amion for pager If no response to pager , please call [phone] until 7pm After 7:00 pm call Elink  [phone]

## 2023-12-22 ENCOUNTER — Encounter (HOSPITAL_COMMUNITY): Payer: Self-pay | Admitting: Neurosurgery

## 2023-12-22 DIAGNOSIS — G959 Disease of spinal cord, unspecified: Secondary | ICD-10-CM | POA: Diagnosis not present

## 2023-12-22 LAB — CBC
HCT: 26.7 % — ABNORMAL LOW (ref 39.0–52.0)
Hemoglobin: 8.4 g/dL — ABNORMAL LOW (ref 13.0–17.0)
MCH: 33.6 pg (ref 26.0–34.0)
MCHC: 31.5 g/dL (ref 30.0–36.0)
MCV: 106.8 fL — ABNORMAL HIGH (ref 80.0–100.0)
Platelets: 339 10*3/uL (ref 150–400)
RBC: 2.5 MIL/uL — ABNORMAL LOW (ref 4.22–5.81)
RDW: 14.7 % (ref 11.5–15.5)
WBC: 10 10*3/uL (ref 4.0–10.5)
nRBC: 0.2 % (ref 0.0–0.2)

## 2023-12-22 LAB — BASIC METABOLIC PANEL WITH GFR
Anion gap: 11 (ref 5–15)
BUN: 37 mg/dL — ABNORMAL HIGH (ref 8–23)
CO2: 21 mmol/L — ABNORMAL LOW (ref 22–32)
Calcium: 8.1 mg/dL — ABNORMAL LOW (ref 8.9–10.3)
Chloride: 104 mmol/L (ref 98–111)
Creatinine, Ser: 1.6 mg/dL — ABNORMAL HIGH (ref 0.61–1.24)
GFR, Estimated: 48 mL/min — ABNORMAL LOW (ref >60)
Glucose, Bld: 149 mg/dL — ABNORMAL HIGH (ref 70–99)
Potassium: 4.9 mmol/L (ref 3.5–5.1)
Sodium: 136 mmol/L (ref 135–145)

## 2023-12-22 LAB — GLUCOSE, CAPILLARY
Glucose-Capillary: 174 mg/dL — ABNORMAL HIGH (ref 70–99)
Glucose-Capillary: 151 mg/dL — ABNORMAL HIGH (ref 70–99)
Glucose-Capillary: 246 mg/dL — ABNORMAL HIGH (ref 70–99)
Glucose-Capillary: 161 mg/dL — ABNORMAL HIGH (ref 70–99)

## 2023-12-22 NOTE — Progress Notes (Signed)
 NAME:  Kevin Wilson, MRN:  [id number], DOB:  12-Jan-1961, LOS: 2 ADMISSION DATE:  12/20/2023, CONSULTATION DATE:  12/22/23 REFERRING MD:  Louis, CHIEF COMPLAINT:  weakness   History of Present Illness:  Kevin Wilson is a 63 y.o. M with PMH significant for metastatic prostate Ca with mets to the spine and bilateral shoulders, HTN who has noted progressively worsening LE weakness over the last two months that rapidly worsened over the last week to the point that he can no longer ambulate.  He was taken to the ER at [facility] and MRI revealed diffuse metastatic disease throughout the lower cervical and upper thoracic spine with effacement of the thecal sac and mass effect on the spinal cord.  He was seen by neurosurgery with a plan for cervical decompression in the morning.   Pertinent  Medical History   has a past medical history of BPH (benign prostatic hyperplasia), Cancer (HCC), Chronic kidney disease, Heart murmur, Hypertension, Inguinal hernia, and Nonrheumatic aortic (valve) stenosis.   Significant Hospital Events: Including procedures, antibiotic start and stop dates in addition to other pertinent events   6/21 admit for cervical decompression  Interim History / Subjective:  Pt is alert and stable without complaints   Objective    Blood pressure (!) 147/81, pulse 77, temperature 98.5 F (36.9 C), temperature source Oral, resp. rate 10, height 5' 7.99 (1.727 m), weight 91.1 kg, SpO2 97%.        Intake/Output Summary (Last 24 hours) at 12/22/2023 0755 Last data filed at 12/22/2023 0700 Gross per 24 hour  Intake 1702.55 ml  Output 3275 ml  Net -1572.45 ml   Filed Weights   12/20/23 2325 12/21/23 0742  Weight: 91.1 kg 91.1 kg   General: no acute distress, resting in bed HEENT: [place]/AT, moist mucous membranes, sclera anicteric Neuro: A&O x 3, weakness of upper extremities CV: rrr, s1s2, no murmurs PULM: clear to auscultation bilaterally. 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 HTN History of metastatic prostate Ca  -s/p cervical and thoracic spinal decompression 6/21 -continue dexamethasone  -continue home labetalol  and norvasc   -prn Labetalol  to maintain SBP <160 - PT/OT - miralax  for bowel regimen   Best Practice (right click and Reselect all SmartList Selections daily)   Diet/type: Regular consistency (see orders) DVT prophylaxis SCD Pressure ulcer(s): N/A GI prophylaxis: N/A Lines: N/A Foley:  N/A Code Status:  DNR Last date of multidisciplinary goals of care discussion [pending ]  Labs   CBC: Recent Labs  Lab 12/21/23 0214 12/22/23 0607  WBC 8.4 10.0  HGB 9.0* 8.4*  HCT 28.8* 26.7*  MCV 106.7* 106.8*  PLT 380 339    Basic Metabolic Panel: Recent Labs  Lab 12/21/23 0214 12/22/23 0607  NA 138 136  K 4.4 4.9  CL 106 104  CO2 21* 21*  GLUCOSE 142* 149*  BUN 33* 37*  CREATININE 1.62* 1.60*  CALCIUM 8.8* 8.1*   GFR: Estimated Creatinine Clearance: 51.1 mL/min (A) (by C-G formula based on SCr of 1.6 mg/dL (H)). Recent Labs  Lab 12/21/23 0214 12/22/23 0607  WBC 8.4 10.0    Liver Function Tests: Recent Labs  Lab 12/21/23 0214  AST 30  ALT 31  ALKPHOS 63  BILITOT <0.2  PROT 6.4*  ALBUMIN 2.9*   No results for input(s): LIPASE, AMYLASE in the last  168 hours. No results for input(s): AMMONIA in the last 168 hours.  ABG No results found for: PHART, PCO2ART, PO2ART, HCO3, TCO2, ACIDBASEDEF, O2SAT   Coagulation Profile: No results for input(s): INR, PROTIME in the last 168 hours.  Cardiac Enzymes: No results for input(s): CKTOTAL, CKMB, CKMBINDEX, TROPONINI in the last 168 hours.  HbA1C: Hgb A1c MFr Bld  Date/Time Value Ref Range Status  12/21/2023 05:00 PM 5.8 (H) 4.8 - 5.6 % Final    Comment:     (NOTE) Diagnosis of Diabetes The following HbA1c ranges recommended by the American Diabetes Association (ADA) may be used as an aid in the diagnosis of diabetes mellitus.  Hemoglobin             Suggested A1C NGSP%              Diagnosis  <5.7                   Non Diabetic  5.7-6.4                Pre-Diabetic  >6.4                   Diabetic  <7.0                   Glycemic control for                       adults with diabetes.      CBG: Recent Labs  Lab 12/21/23 1609 12/21/23 2112  GLUCAP 169* 174*    Critical care time: n/a     Dorn Chill, MD [facility] Office: [phone]   See Amion for personal pager PCCM on call pager [phone] until 7pm. Please call Elink 7p-7a. [phone]

## 2023-12-22 NOTE — Progress Notes (Signed)
 Postop day 1.  Patient with minimal neck pain.  Denies radiating pain.  Does have some increased function in both upper and lower extremities.  Awake and alert.  Oriented and appropriate.  Vital signs are stable.  Urine output good.  Drain output moderate.  Motor examination reveals 5/5 strength in biceps and deltoid muscle groups bilaterally.  His wrist extensor strength is significantly improved and near normal bilaterally.  His triceps function is markedly improved with 4 to 4+/5 strength in both triceps.  Has increased grip strength bilaterally grading out at 2-3/5.  Has some 2/5 intrinsic strength in his right hand.  1/5 intrinsic strength in his left hand.  Lower extremity strength improved.  Patient now 3/5 in his right lower extremity, 2/5 in his left lower extremity.  Wound clean and dry.  Chest and abdomen benign.  Status post emergent decompressive cervical laminectomy and resection of epidural tumor.  Patient with a known history of prostate carcinoma and is under treatment with an oncologist in [place] .  Begin efforts at mobilization and rehabilitation.  We need to find out whether patient can be transferred back to [facility] for further convalescence and rehabilitation or whether I need to get oncology and radiation oncology involved here in [place].

## 2023-12-22 NOTE — Progress Notes (Signed)
 Arrived via bed from 4N ICU; oriented patient to room and unit routine. Patient is alert and reports no pain presently; assisted with soft call bell, food and drink and repositioned in the bed.

## 2023-12-22 NOTE — Evaluation (Signed)
 Physical Therapy Evaluation Patient Details Name: Kevin Wilson MRN: [id number] DOB: [birth year] Today's Date: 12/22/2023  History of Present Illness  Pt is a 63 y/o male presenting on 6/20 with weakness. 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 6/21 S/P C5, C6, C7, T1, T2 laminectomy with resection of epidural tumor, microdissection. PMH includes: metastatic prostate carcinoma.   Clinical Impression  Pt in bed upon arrival of PT, agreeable to evaluation at this time. Prior to admission the pt was ambulating independently without need for DME, walking 1.5 miles for exercise, living alone, and without any other falls. The pt  now presents with limitations in functional mobility, strength, ROM, coordination, endurance, and balance due to above dx, and will continue to benefit from skilled PT to address these deficits. The pt demos better activation and strength in RLE compared to LLE, but is 2+/5 at best in R quad, 2-/5 at hip and ankle, and 1/5 in HS. Pt with inconsistent activation of L quad and less ability to demo movement at toes or knee in LLE. He tolerated rolling and use of lift this session with VSS, will benefit from intensive therapies to progress functional strength, stability, and ability to complete transfers to allow for greatest return to independence.         If plan is discharge home, recommend the following: Two people to help with walking and/or transfers;Two people to help with bathing/dressing/bathroom;Assistance with cooking/housework;Assistance with feeding;Direct supervision/assist for medications management;Direct supervision/assist for financial management;Assist for transportation;Help with stairs or ramp for entrance   Can travel by private vehicle        Equipment Recommendations Wheelchair (measurements PT);Wheelchair cushion (measurements PT);Hoyer lift  Recommendations  for [employer]  Rehab consult    Functional Status Assessment Patient has had a recent decline in their functional status and demonstrates the ability to make significant improvements in function in a reasonable and predictable amount of time.     Precautions / Restrictions Precautions Precautions: Cervical;Fall Precaution Booklet Issued: No Recall of Precautions/Restrictions: Impaired Precaution/Restrictions Comments: requires cueing for cervical precautions during session Required Braces or Orthoses:  (no brace order) Restrictions Weight Bearing Restrictions Per Provider Order: No      Mobility  Bed Mobility Overal bed mobility: Needs Assistance Bed Mobility: Rolling Rolling: Mod assist         General bed mobility comments: log rolling in bed to place sling    Transfers Overall transfer level: Needs assistance Equipment used: Ambulation equipment used Transfers: Bed to chair/wheelchair/BSC             General transfer comment: to recliner, pt tolerated well Transfer via Lift Equipment: Maxisky  Ambulation/Gait               General Gait Details: deferred due to limited LE activation     Balance Overall balance assessment: Needs assistance Sitting-balance support: Single extremity supported, Bilateral upper extremity supported, Feet supported, No upper extremity supported Sitting balance-Leahy Scale: Poor Sitting balance - Comments: supported sitting in recliner, able to lift L UE and sustain upright; R lateral lean with lifting R UE. Postural control: Right lateral lean                                   Pertinent Vitals/Pain Pain Assessment Pain Assessment: No/denies pain    Home Living Family/patient expects to be discharged to:: Private residence  Living Arrangements: Alone Available Help at Discharge: Family;Available 24 hours/day (sister, daughter) Type of Home: House Home Access: Stairs to enter Entrance Stairs-Rails:  None Entrance Stairs-Number of Steps: 1   Home Layout: One level Home Equipment: Grab bars - tub/shower;Hand held shower head;Grab bars - toilet;Shower Counsellor (2 wheels);Cane - single point      Prior Function Prior Level of Function : Independent/Modified Independent             Mobility Comments: was walking independently, then over the last 2 weeks his brother has been picking him up and carrying him ADLs Comments: independent with ADLs, IADLs, driving; needing increased assist over the last 2 weeks     Extremity/Trunk Assessment   Upper Extremity Assessment Upper Extremity Assessment: Defer to OT evaluation RUE Deficits / Details: grossly weak, 3/5 MMT shoulder, elbow, wrist, forearm. Able to grasp 3-/5, holds utensils with red foam handle.  Approx 75% hand extension. RUE Sensation: WNL RUE Coordination: decreased fine motor;decreased gross motor LUE Deficits / Details: grossly weak, 3/5 MMT shoulder, elbow, wrist, forearm. Able to grasp 2/5, holds cup with both hands. Approx 50% hand extension. LUE Sensation: WNL LUE Coordination: decreased fine motor;decreased gross motor    Lower Extremity Assessment Lower Extremity Assessment: RLE deficits/detail;LLE deficits/detail RLE Deficits / Details: trace activation at toes when in supine, no activation/movement when cued again when sitting in recliner. trace activation of quad initially, able to complete x5 LAQ through partial ROM. trace activation of HS and hip abd/add. reports sensation intact RLE Sensation: WNL RLE Coordination: decreased gross motor LLE Deficits / Details: pt with inconsistent activation of quad, no active movement of toes to command, no perceptible activation of HS or hip abd/add. LLE Sensation: WNL LLE Coordination: decreased gross motor    Cervical / Trunk Assessment Cervical / Trunk Assessment: Neck Surgery;Back Surgery  Communication   Communication Communication: No apparent  difficulties    Cognition Arousal: Alert Behavior During Therapy: WFL for tasks assessed/performed   PT - Cognitive impairments: No apparent impairments                       PT - Cognition Comments: pt following commands appropriately Following commands: Intact       Cueing Cueing Techniques: Verbal cues     General Comments General comments (skin integrity, edema, etc.): VSS on RA    Exercises General Exercises - Lower Extremity Ankle Circles/Pumps: AAROM, Right, PROM, Left, 5 reps Quad Sets: AROM, Right, 5 reps Long Arc Quad: AAROM, Right, PROM, Left, 10 reps Hip ABduction/ADduction: AAROM, Right, PROM, Left, 5 reps   Assessment/Plan    PT Assessment Patient needs continued PT services  PT Problem List Decreased strength;Decreased range of motion;Decreased activity tolerance;Decreased balance;Decreased mobility;Decreased coordination       PT Treatment Interventions DME instruction;Gait training;Stair training;Functional mobility training;Therapeutic activities;Therapeutic exercise;Balance training;Neuromuscular re-education;Patient/family education    PT Goals (Current goals can be found in the Care Plan section)  Acute Rehab PT Goals Patient Stated Goal: to regain strength and independence PT Goal Formulation: With patient Time For Goal Achievement: 01/05/24 Potential to Achieve Goals: Good    Frequency Min 3X/week     Co-evaluation   Reason for Co-Treatment: For patient/therapist safety;To address functional/ADL transfers;Complexity of the patient's impairments (multi-system involvement) PT goals addressed during session: Mobility/safety with mobility;Balance;Strengthening/ROM OT goals addressed during session: ADL's and self-care       AM-PAC PT 6 Clicks Mobility  Outcome Measure Help needed turning from  your back to your side while in a flat bed without using bedrails?: Total Help needed moving from lying on your back to sitting on the side  of a flat bed without using bedrails?: Total Help needed moving to and from a bed to a chair (including a wheelchair)?: Total Help needed standing up from a chair using your arms (e.g., wheelchair or bedside chair)?: Total Help needed to walk in hospital room?: Total Help needed climbing 3-5 steps with a railing? : Total 6 Click Score: 6    End of Session Equipment Utilized During Treatment: Other (comment) (lift) Activity Tolerance: Patient tolerated treatment well Patient left: in chair;with call bell/phone within reach;with chair alarm set Nurse Communication: Mobility status;Need for lift equipment PT Visit Diagnosis: Unsteadiness on feet (R26.81);Other abnormalities of gait and mobility (R26.89);History of falling (Z91.81);Muscle weakness (generalized) (M62.81)    Time: 9061-8982 PT Time Calculation (min) (ACUTE ONLY): 39 min   Charges:   PT Evaluation $PT Eval Moderate Complexity: 1 Mod   PT General Charges $$ ACUTE PT VISIT: 1 Visit         Izetta Call, PT, DPT   Acute Rehabilitation Department Office [phone] Secure Chat Communication Preferred  Izetta JULIANNA Call 12/22/2023, 1:49 PM

## 2023-12-22 NOTE — Progress Notes (Signed)
Inpatient Rehab Admissions Coordinator:  ° °Per therapy recommendation,  patient was screened for CIR candidacy by Ltanya Bayley, MS, CCC-SLP. At this time, Pt. Appears to be a a potential candidate for CIR. I will place   order for rehab consult per protocol for full assessment. Please contact me any with questions. ° °Jacy Brocker, MS, CCC-SLP °Rehab Admissions Coordinator  °[phone] (celll) °[phone] (office) ° °

## 2023-12-22 NOTE — Evaluation (Signed)
 Occupational Therapy Evaluation Patient Details Name: Kevin Wilson MRN: [id number] DOB: 1961/04/15 Today's Date: 12/22/2023   History of Present Illness   Pt is a 63 y/o male presenting on 6/20 with weakness. 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 6/21 S/P C5, C6, C7, T1, T2 laminectomy with resection of epidural tumor, microdissection. PMH includes: metastatic prostate carcinoma.     Clinical Impressions PTA patient reports independent with ADLs, IADLs and mobility with decline in function over the last 65 weeks needing assist from his family.  He was admitted for above and presents with problem list below.  He requires mod assist to roll in bed, +2 total assist to lift to recliner using maxisky, and requires min-mod assist for sitting balance unsupported (especially without R UE support). He requires mod to total assist for ADLs.  He presents with generalized weakness, decreased coordination and decreased functional use of B hands (L more than R). Will monitor for splinting needs and AE for self feeding (red foam already provided).  Based on performance today, believe pt will best benefit from continued OT services acutely and after dc at an inpatient setting with >3hrs/day to optimize independence, safety and return to PLOF with ADLs and mobility.      If plan is discharge home, recommend the following:   Two people to help with walking and/or transfers;A lot of help with bathing/dressing/bathroom;Direct supervision/assist for medications management;Direct supervision/assist for financial management;Assist for transportation;Help with stairs or ramp for entrance;Assistance with feeding;Assistance with cooking/housework     Functional Status Assessment   Patient has had a recent decline in their functional status and demonstrates the ability to make significant improvements in function in a  reasonable and predictable amount of time.     Equipment Recommendations   Other (comment) (defer)     Recommendations for Other Services   Rehab consult     Precautions/Restrictions   Precautions Precautions: Cervical;Fall Precaution Booklet Issued: No Recall of Precautions/Restrictions: Impaired Precaution/Restrictions Comments: requires cueing for cervical precautions during session Required Braces or Orthoses:  (no brace order)     Mobility Bed Mobility Overal bed mobility: Needs Assistance Bed Mobility: Rolling Rolling: Mod assist         General bed mobility comments: log rolling in bed to place sling    Transfers Overall transfer level: Needs assistance Equipment used: Ambulation equipment used Transfers: Bed to chair/wheelchair/BSC             General transfer comment: to recliner Transfer via Lift Equipment: Maxisky    Balance Overall balance assessment: Needs assistance Sitting-balance support: Single extremity supported, Bilateral upper extremity supported, Feet supported, No upper extremity supported Sitting balance-Leahy Scale: Poor Sitting balance - Comments: supported sitting in recliner, able to lift L UE and sustain upright; R lateral lean with lifting R UE. Postural control: Right lateral lean                                 ADL either performed or assessed with clinical judgement   ADL Overall ADL's : Needs assistance/impaired Eating/Feeding: Moderate assistance;Sitting;With adaptive utensils Eating/Feeding Details (indicate cue type and reason): able to self feed a few bites using red foam handles but fatigues and needs assist to scoop after several bites; reaches for drink with B hands would benefit from lidded cup with handle Grooming: Moderate assistance;Sitting  Upper Body Dressing : Maximal assistance;Sitting   Lower Body Dressing: Total assistance;+2 for physical assistance;Sitting/lateral  leans;Bed level   Toilet Transfer: Total assistance;+2 for physical assistance Toilet Transfer Details (indicate cue type and reason): maxi sky to chair                 Vision Baseline Vision/History: 1 Wears glasses Ability to See in Adequate Light: 0 Adequate Patient Visual Report: No change from baseline Vision Assessment?: No apparent visual deficits     Perception         Praxis         Pertinent Vitals/Pain Pain Assessment Pain Assessment: No/denies pain     Extremity/Trunk Assessment Upper Extremity Assessment Upper Extremity Assessment: RUE deficits/detail;LUE deficits/detail;Right hand dominant RUE Deficits / Details: grossly weak, 3/5 MMT shoulder, elbow, wrist, forearm. Able to grasp 3-/5, holds utensils with red foam handle.  Approx 75% hand extension. RUE Sensation: WNL RUE Coordination: decreased fine motor;decreased gross motor LUE Deficits / Details: grossly weak, 3/5 MMT shoulder, elbow, wrist, forearm. Able to grasp 2/5, holds cup with both hands. Approx 50% hand extension. LUE Sensation: WNL LUE Coordination: decreased fine motor;decreased gross motor   Lower Extremity Assessment Lower Extremity Assessment: Defer to PT evaluation   Cervical / Trunk Assessment Cervical / Trunk Assessment: Neck Surgery;Back Surgery   Communication Communication Communication: No apparent difficulties   Cognition Arousal: Alert Behavior During Therapy: WFL for tasks assessed/performed Cognition: No apparent impairments                               Following commands: Intact       Cueing  General Comments   Cueing Techniques: Verbal cues  VSS on RA   Exercises     Shoulder Instructions      Home Living Family/patient expects to be discharged to:: Private residence Living Arrangements: Alone Available Help at Discharge: Family;Available 24 hours/day (sister, daughter) Type of Home: House Home Access: Stairs to enter [employer] of Steps: 1 Entrance Stairs-Rails: None Home Layout: One level     Bathroom Shower/Tub: Producer, television/film/video: Standard     Home Equipment: Grab bars - tub/shower;Hand held shower head;Grab bars - toilet;Shower Counsellor (2 wheels);Cane - single point          Prior Functioning/Environment Prior Level of Function : Independent/Modified Independent             Mobility Comments: was walking independently, then over the last 65 weeks his brother has been picking him up and carrying him ADLs Comments: independent with ADLs, IADLs, driving; needing increased assist over the last 65 weeks    OT Problem List: Decreased strength;Decreased activity tolerance;Impaired balance (sitting and/or standing);Impaired UE functional use;Decreased knowledge of precautions;Decreased knowledge of use of DME or AE;Decreased coordination   OT Treatment/Interventions: Self-care/ADL training;Therapeutic exercise;DME and/or AE instruction;Therapeutic activities;Patient/family education;Balance training;Energy conservation;Neuromuscular education;Splinting      OT Goals(Current goals can be found in the care plan section)   Acute Rehab OT Goals Patient Stated Goal: get better OT Goal Formulation: With patient Time For Goal Achievement: 01/05/24 Potential to Achieve Goals: Good   OT Frequency:  Min 2X/week    Co-evaluation PT/OT/SLP Co-Evaluation/Treatment: Yes Reason for Co-Treatment: For patient/therapist safety;To address functional/ADL transfers;Complexity of the patient's impairments (multi-system involvement)   OT goals addressed during session: ADL's and self-care      AM-PAC OT 6 Clicks Daily Activity  Outcome Measure Help from another person eating meals?: A Lot Help from another person taking care of personal grooming?: A Lot Help from another person toileting, which includes using toliet, bedpan, or urinal?: Total Help from another person  bathing (including washing, rinsing, drying)?: A Lot Help from another person to put on and taking off regular upper body clothing?: A Lot Help from another person to put on and taking off regular lower body clothing?: Total 6 Click Score: 10   End of Session Nurse Communication: Mobility status  Activity Tolerance: Patient tolerated treatment well Patient left: in chair;with call bell/phone within reach;with chair alarm set  OT Visit Diagnosis: Other abnormalities of gait and mobility (R26.89);Muscle weakness (generalized) (M62.81);Other symptoms and signs involving the nervous system (R29.898)                Time: 9061-8982 OT Time Calculation (min): 39 min Charges:  OT General Charges $OT Visit: 1 Visit OT Evaluation $OT Eval Moderate Complexity: 1 Mod OT Treatments $Self Care/Home Management : 8-22 mins  Etta NOVAK, OT Acute Rehabilitation Services Office [phone] Secure Chat Preferred    Etta GORMAN Hope 12/22/2023, 11:14 AM

## 2023-12-23 LAB — GLUCOSE, CAPILLARY
Glucose-Capillary: 160 mg/dL — ABNORMAL HIGH (ref 70–99)
Glucose-Capillary: 136 mg/dL — ABNORMAL HIGH (ref 70–99)
Glucose-Capillary: 226 mg/dL — ABNORMAL HIGH (ref 70–99)
Glucose-Capillary: 273 mg/dL — ABNORMAL HIGH (ref 70–99)

## 2023-12-23 NOTE — TOC CM/SW Note (Signed)
 Transition of Care [facility]) - Inpatient Brief Assessment   Patient Details  Name: Kevin Wilson MRN: [id number] Date of Birth: 09-Dec-1960  [facility] CM/SW Contact:    Andrez JULIANNA Barton, RN Phone Number: 12/23/2023, 1:35 PM   Clinical Narrative:  Pt s/p: emergent decompressive cervical laminectomy with resection of epidural tumor. Patient somewhat improved following surgery but still profoundly quadriparetic.  CIR following and awaiting determination of oncology plans prior to full work up.  TOC following.  Transition of Care Asessment: Insurance and Status: Insurance coverage has been reviewed Patient has primary care physician: Yes Home environment has been reviewed: from home alone   Prior/Current Home Services: No current home services Social Drivers of Health Review: SDOH reviewed no interventions necessary Readmission risk has been reviewed: Yes Transition of care needs: transition of care needs identified, TOC will continue to follow

## 2023-12-23 NOTE — Progress Notes (Signed)
 Postop day 2.  Minimal neck pain.  No radiating pain.  No new issues or problems.  Patient reports flatus but no BM.  Patient is voiding well.  Awake and alert.  Oriented and appropriate.  Motor examination stable.  Still with good proximal strength in both upper extremities.  Diminished tricep strength bilaterally but improved from preop.  3/5 grip strength in right hand 2/5 grip strength in left hand.  2/5 finger extensors in the right hand 1-2/5 in the left hand.  2-3/5 in bilateral lower extremities right somewhat stronger than left.  Wound clean and dry.  Chest and abdomen benign.  Status post emergent decompressive cervical laminectomy with resection of epidural tumor.  Patient somewhat improved following surgery but still profoundly quadriparetic.  Continue steroids.  Continue therapies.  Work toward rehabilitation.  Patient will need further adjunctive care to his metastatic disease.  He has a current oncologist in [place] .  I will attempt to contact his office today.

## 2023-12-23 NOTE — Progress Notes (Addendum)
 Occupational Therapy Treatment Patient Details Name: Kevin Wilson MRN: [id number] DOB: 1960/07/24 Today's Date: 12/23/2023   History of present illness Pt is a 63 y/o male presenting on 6/20 with weakness. Found with profound cervical myelopathy with increasing quadriparesis worrisome for severe spinal stenosis (metastatic per MRI). 6/21 S/P C5, C6, C7, T1, T2 laminectomy with resection of epidural tumor, microdissection. PMH includes: metastatic prostate carcinoma.   OT comments  Pt supine in bed and agreeable to OT/PT session.  Requires max assist to roll and max assist +2 to sit EOB, poor sitting balance needing max assist statically at EOB.  Brief moments of min assist but unable to sustain statically, tends to loose balance posterior and towards L today.  Plan for propped sitting next session.  He reports able to place foam handle on utensils on his own and managing self feeding.  Does require cueing for cervical precautions during session.  Reached out to MD about soft collar for comfort, due to neck pain with mobilization. Will benefit from continued OT services, recommend >3hrs/day inpatient setting at dc. Will follow.       If plan is discharge home, recommend the following:  Two people to help with walking and/or transfers;A lot of help with bathing/dressing/bathroom;Direct supervision/assist for medications management;Direct supervision/assist for financial management;Assist for transportation;Help with stairs or ramp for entrance;Assistance with feeding;Assistance with cooking/housework   Equipment Recommendations  Other (comment) (defer)    Recommendations for Other Services Rehab consult    Precautions / Restrictions Precautions Precautions: Cervical;Fall Precaution Booklet Issued: No Recall of Precautions/Restrictions: Impaired Precaution/Restrictions Comments: requires cueing for cervical precautions during session Required Braces or Orthoses:  (no brace  order) Restrictions Weight Bearing Restrictions Per Provider Order: No       Mobility Bed Mobility Overal bed mobility: Needs Assistance Bed Mobility: Rolling, Sidelying to Sit Rolling: Max assist Sidelying to sit: Max assist, +2 for physical assistance, HOB elevated, Used rails       General bed mobility comments: log roll to EOB, assist for LB towards EOB and +2 to ascend trunk.    Transfers                   General transfer comment: deferred     Balance Overall balance assessment: Needs assistance Sitting-balance support: No upper extremity supported, Feet supported, Bilateral upper extremity supported Sitting balance-Leahy Scale: Poor Sitting balance - Comments: EOB with max assist, brief moments of min assist with B hands on knees. tends to loose balance towards L and posteriorly today.  Poor tolerance. Postural control: Posterior lean, Left lateral lean                                 ADL either performed or assessed with clinical judgement   ADL Overall ADL's : Needs assistance/impaired                 Upper Body Dressing : Maximal assistance;Bed level   Lower Body Dressing: Maximal assistance;Bed level               Functional mobility during ADLs: Total assistance;Maximal assistance;+2 for physical assistance General ADL Comments: focused on EOB balance    Extremity/Trunk Assessment Upper Extremity Assessment Upper Extremity Assessment: RUE deficits/detail;LUE deficits/detail RUE Deficits / Details: grossly weak, 3/5 MMT shoulder, elbow, wrist, forearm. Able to grasp 3-/5, holds utensils with red foam handle.  Approx 75% hand extension.  Minimal finger flexion/extension with  wrist stabilized. RUE Sensation: WNL RUE Coordination: decreased fine motor;decreased gross motor LUE Deficits / Details: grossly weak, 3/5 MMT shoulder, elbow, wrist, forearm. Able to grasp 2/5, holds cup with both hands. Approx 50% hand extension.  Minimal finger flexion/extension with wrist stabilized (less than R UE) LUE Sensation: WNL LUE Coordination: decreased fine motor;decreased gross motor   Lower Extremity Assessment Lower Extremity Assessment: Defer to PT evaluation        Vision   Vision Assessment?: No apparent visual deficits   Perception     Praxis     Communication Communication Communication: No apparent difficulties   Cognition Arousal: Alert Behavior During Therapy: WFL for tasks assessed/performed Cognition: No apparent impairments                               Following commands: Intact        Cueing   Cueing Techniques: Verbal cues  Exercises      Shoulder Instructions       General Comments      Pertinent Vitals/ Pain       Pain Assessment Pain Assessment: Faces Faces Pain Scale: Hurts whole lot Pain Location: neck Pain Descriptors / Indicators: Discomfort, Operative site guarding, Grimacing Pain Intervention(s): Limited activity within patient's tolerance, Monitored during session, Repositioned  Home Living                                          Prior Functioning/Environment              Frequency  Min 2X/week        Progress Toward Goals  OT Goals(current goals can now be found in the care plan section)  Progress towards OT goals: Progressing toward goals  Acute Rehab OT Goals Patient Stated Goal: get to rehab OT Goal Formulation: With patient Time For Goal Achievement: 01/05/24 Potential to Achieve Goals: Good  Plan      Co-evaluation    PT/OT/SLP Co-Evaluation/Treatment: Yes Reason for Co-Treatment: For patient/therapist safety;To address functional/ADL transfers;Complexity of the patient's impairments (multi-system involvement)   OT goals addressed during session: ADL's and self-care      AM-PAC OT 6 Clicks Daily Activity     Outcome Measure   Help from another person eating meals?: A Lot Help from another  person taking care of personal grooming?: A Lot Help from another person toileting, which includes using toliet, bedpan, or urinal?: Total Help from another person bathing (including washing, rinsing, drying)?: A Lot Help from another person to put on and taking off regular upper body clothing?: A Lot Help from another person to put on and taking off regular lower body clothing?: Total 6 Click Score: 10    End of Session    OT Visit Diagnosis: Other abnormalities of gait and mobility (R26.89);Muscle weakness (generalized) (M62.81);Other symptoms and signs involving the nervous system (R29.898)   Activity Tolerance Patient tolerated treatment well   Patient Left in bed;with call bell/phone within reach;with bed alarm set;with SCD's reapplied   Nurse Communication Mobility status        Time: 8780-8756 OT Time Calculation (min): 24 min  Charges: OT General Charges $OT Visit: 1 Visit OT Treatments $Self Care/Home Management : 8-22 mins  Etta NOVAK, OT Acute Rehabilitation Services Office [phone] Secure Chat Preferred    Etta GORMAN Hope 12/23/2023, 2:02  PM

## 2023-12-23 NOTE — Progress Notes (Signed)
 Physical Therapy Treatment Patient Details Name: Kevin Wilson MRN: [id number] DOB: October 13, 1960 Today's Date: 12/23/2023   History of Present Illness Pt is a 63 y/o male presenting on 6/20 with weakness. Found with profound cervical myelopathy with increasing quadriparesis worrisome for severe spinal stenosis (metastatic per MRI). 6/21 S/P C5, C6, C7, T1, T2 laminectomy with resection of epidural tumor, microdissection. PMH includes: metastatic prostate carcinoma.    PT Comments  Pt received in supine and agreeable to session. Pt demonstrates good effort throughout session, however is limited by impaired activity tolerance and neck pain. Pt requires up to max A +2 to sit EOB and demonstrates poor sitting balance requiring up to max A  to maintain balance. Focus on improving sitting balance and LE muscle activation. Pt reports increased neck pain with prolonged sitting, so returned to supine. Pt continues to benefit from PT services to progress toward functional mobility goals.    If plan is discharge home, recommend the following: Two people to help with walking and/or transfers;Two people to help with bathing/dressing/bathroom;Assistance with cooking/housework;Assistance with feeding;Direct supervision/assist for medications management;Direct supervision/assist for financial management;Assist for transportation;Help with stairs or ramp for entrance   Can travel by private vehicle        Equipment Recommendations  Wheelchair (measurements PT);Wheelchair cushion (measurements PT);Hoyer lift    Recommendations for [employer]       Precautions / Restrictions Precautions Precautions: Cervical;Fall Recall of Precautions/Restrictions: Impaired Precaution/Restrictions Comments: requires cueing for cervical precautions during session Restrictions Weight Bearing Restrictions Per Provider Order: No     Mobility  Bed Mobility Overal bed mobility: Needs Assistance Bed Mobility:  Rolling, Sidelying to Sit, Sit to Sidelying Rolling: Max assist Sidelying to sit: Max assist, +2 for physical assistance, HOB elevated, Used rails     Sit to sidelying: Total assist, +2 for physical assistance General bed mobility comments: Assist for BLE and trunk management. cues for sequencing and technique    Transfers                   General transfer comment: deferred    Ambulation/Gait                   Stairs             Wheelchair Mobility     Tilt Bed    Modified Rankin (Stroke Patients Only)       Balance Overall balance assessment: Needs assistance Sitting-balance support: No upper extremity supported, Feet supported, Bilateral upper extremity supported Sitting balance-Leahy Scale: Poor Sitting balance - Comments: EOB with max assist, brief moments of min assist with B hands on knees. tends to loose balance towards L and posteriorly today.  Poor tolerance. Postural control: Posterior lean, Left lateral lean     Standing balance comment: nt                            Communication Communication Communication: No apparent difficulties  Cognition Arousal: Alert Behavior During Therapy: WFL for tasks assessed/performed   PT - Cognitive impairments: No apparent impairments                         Following commands: Intact      Cueing Cueing Techniques: Verbal cues  Exercises Other Exercises Other Exercises: AAROM towel slides forwards and backwards with RLE x5. Assist to maintain 90 degrees hip flexion    General Comments  Pertinent Vitals/Pain Pain Assessment Pain Assessment: Faces Faces Pain Scale: Hurts whole lot Pain Location: neck Pain Descriptors / Indicators: Discomfort, Operative site guarding, Grimacing Pain Intervention(s): Limited activity within patient's tolerance, Monitored during session, Repositioned     PT Goals (current goals can now be found in the care plan section)  Acute Rehab PT Goals Patient Stated Goal: to regain strength and independence PT Goal Formulation: With patient Time For Goal Achievement: 01/05/24 Progress towards PT goals: Progressing toward goals    Frequency    Min 3X/week           Co-evaluation PT/OT/SLP Co-Evaluation/Treatment: Yes Reason for Co-Treatment: For patient/therapist safety;To address functional/ADL transfers;Complexity of the patient's impairments (multi-system involvement) PT goals addressed during session: Mobility/safety with mobility;Balance;Strengthening/ROM OT goals addressed during session: ADL's and self-care      AM-PAC PT 6 Clicks Mobility   Outcome Measure  Help needed turning from your back to your side while in a flat bed without using bedrails?: Total Help needed moving from lying on your back to sitting on the side of a flat bed without using bedrails?: Total Help needed moving to and from a bed to a chair (including a wheelchair)?: Total Help needed standing up from a chair using your arms (e.g., wheelchair or bedside chair)?: Total Help needed to walk in hospital room?: Total Help needed climbing 3-5 steps with a railing? : Total 6 Click Score: 6    End of Session   Activity Tolerance: Patient tolerated treatment well Patient left: in bed;with call bell/phone within reach;with bed alarm set Nurse Communication: Mobility status PT Visit Diagnosis: Unsteadiness on feet (R26.81);Other abnormalities of gait and mobility (R26.89);History of falling (Z91.81);Muscle weakness (generalized) (M62.81)     Time: 8780-8756 PT Time Calculation (min) (ACUTE ONLY): 24 min  Charges:    $Therapeutic Activity: 8-22 mins PT General Charges $$ ACUTE PT VISIT: 1 Visit                     Darryle Teri, PTA Acute Rehabilitation Services Secure Chat Preferred  [phone]    Darryle Remberto 12/23/2023, 3:27 PM

## 2023-12-23 NOTE — Progress Notes (Signed)
 Inpatient Rehab Coordinator Note:  I met with patient at bedside to discuss CIR recommendations and goals/expectations of CIR stay.  We reviewed 3 hrs/day of therapy, physician follow up, and average length of stay 2 weeks (dependent upon progress) with goals of tbd.  He confirms he's receiving radiation via his central port ([place]) in [place].  Pt's sister and daughter able to provide support at discharge.  We discussed need for further information regarding cancer treatment plan in order to better determine goals for AIR stay.  Dr. Louis notes today plan to contact oncologist.  Will continue to follow.   Reche Lowers, PT, DPT Admissions Coordinator [phone] 12/23/23  4:15 PM

## 2023-12-24 DIAGNOSIS — G959 Disease of spinal cord, unspecified: Secondary | ICD-10-CM

## 2023-12-24 LAB — GLUCOSE, CAPILLARY
Glucose-Capillary: 163 mg/dL — ABNORMAL HIGH (ref 70–99)
Glucose-Capillary: 215 mg/dL — ABNORMAL HIGH (ref 70–99)
Glucose-Capillary: 139 mg/dL — ABNORMAL HIGH (ref 70–99)
Glucose-Capillary: 329 mg/dL — ABNORMAL HIGH (ref 70–99)

## 2023-12-24 MED ORDER — SODIUM CHLORIDE 0.9% FLUSH: 10.0000 mL | INTRAVENOUS | Status: DC | PRN

## 2023-12-24 MED ORDER — PANTOPRAZOLE SODIUM 40 MG PO TBEC
40.0000 mg | DELAYED_RELEASE_TABLET | Freq: Two times a day (BID) | ORAL | Status: DC
  Administered 2023-12-24 – 2023-12-27 (×7): 40 mg via ORAL
  Filled 2023-12-24 (×7): qty 1

## 2023-12-24 MED ORDER — SODIUM CHLORIDE 0.9% FLUSH
10.0000 mL | Freq: Two times a day (BID) | INTRAVENOUS | Status: DC
  Administered 2023-12-24 – 2023-12-26 (×4): 10 mL
  Administered 2023-12-26: 30 mL
  Administered 2023-12-27: 10 mL

## 2023-12-24 NOTE — Progress Notes (Signed)
 Inpatient Rehab Coordinator Note:  I spoke with patient's daughter, Winnie, over the phone to discuss CIR recommendations and goals/expectations of CIR stay.  We reviewed 3 hrs/day of therapy, physician follow up, and average length of stay 2 weeks (dependent upon progress) with goals of min assist and 24/7 care.  She was expecting he would likely need significant care at discharge, even with rehabilitation, and unfortunately family is not able to provide 24/7 physical care.  They are open to SNF in [place] and I've passed along facility preference to [facility].  Pt currently listed as uninsured, but [place] states [place] of [place] effective 10[place]31[place]23 and is eligible for Medicare A/B on 12/31/23.  I've updated admitting to verify/correct chart.  We will sign off for CIR at this time.   Reche Lowers, PT, DPT Admissions Coordinator [phone] 12/24/23  1:35 PM

## 2023-12-24 NOTE — Progress Notes (Signed)
 Plan of care is reviewed. Pt has been stable hemodynamically, afebrile, no acute distress overnight. Pain is well controlled. Neuro-check per documented below. Incision site on his posterior neck has minimal serosanguinous drainage. [place] dressing changed. We will continue to monitor.    12/24/23 0410  Neurological  Neuro (WDL) X  Orientation Level Oriented X4  Cognition Appropriate attention/concentration;Appropriate judgement;Appropriate safety awareness;Appropriate for developmental age;Follows commands;No memory impairment  Speech Clear;Appropriate for developmental age;Appropriate at baseline  R Pupil Size (mm) 3  R Pupil Shape Round  R Pupil Reaction Brisk  L Pupil Size (mm) 3  L Pupil Shape Round  L Pupil Reaction Brisk  Motor Function/Sensation Assessment Grip;Head;Elbow extension;Elbow flexion;Pronator drift;Dorsiflexion;Plantar flexion;Arm ataxia;Leg ataxia;Motor response;Sensation;Motor strength  Facial Symmetry Symmetrical  R Hand Grip Weak  L Hand Grip Weak   R Elbow Extension (Push/Biceps) Weak  L Elbow Extension (Push/Biceps) Weak  R Elbow Flexion (Pull/Triceps) Weak  L Elbow Flexion (Pull/Triceps) Weak  Right Pronator Drift Present  Left Pronator Drift Present  R Foot Dorsiflexion Weak  L Foot Dorsiflexion Weak  R Foot Plantar Flexion Weak  L Foot Plantar Flexion Weak  R Finger to Nose (Point to [employer]) Smooth  L Finger to Nose (Point to [employer]) Smooth  R Heel to [employer] (Point to [employer]) Ataxia  L Heel to [employer] (Point to [employer]) Ataxia  RUE Motor Response Purposeful movement  RUE Sensation Full sensation  RUE Motor Strength 4  LUE Motor Response Purposeful movement  LUE Sensation Full sensation  LUE Motor Strength 4  RLE Motor Response Non-purposeful movement  RLE Sensation Full sensation  RLE Motor Strength 1  LLE Motor Response Non-purposeful movement  LLE Sensation Full sensation  LLE Motor Strength 1  Neuro Symptoms Fatigue  Neuro symptoms relieved by  Relaxation techniques (Comment);Rest   Wendi Dash, RN

## 2023-12-24 NOTE — Consult Note (Addendum)
 [facility] CONSULT NOTE  Patient Care Team: Ezzard Elsie NOVAK, MD as PCP - General (Internal Medicine)  CHIEF COMPLAINTS/PURPOSE OF CONSULTATION:  Metastatic Prostate cancer  REFERRING PHYSICIAN: Dr. Louis  HISTORY OF PRESENTING ILLNESS:  Kevin Wilson 63 y.o. male admitted on 12/20/2023 with complaints of progressive weakness over last two months with marked weakness over previous two days prior to admission. Patient became increasingly nonambulatory with minimal movement of lower extremities and upper extremities.  Patient has known history of metastatic prostate cancer and is on active treatment for mets to spine and shoulders.  Therefore Oncology consult has been requested. Patient is seen today awake alert and oriented x3, very pleasant and appears to be good historian, details his history as documented.  States he lives in [place] and has been getting cancer treatments there; he came to [facility] specifically for Neurosurgery.   Medical history as stated is significant for metastatic prostate cancer. Also history of hypertension.  Surgical history significant for robotic assisted radical prostatectomy in 2021.  The patient had received surgery, chemotherapy with excellent response and then switched to Nubeqa.  He was noted to have rising PSA and was subsequently found to have bone metastasis with pain.  According to the patient, he feels that his pain is well-controlled after 3 infusion of Xofigo (Radium 223) Family history significant for multiple family members with cancer; brother with pancreatic cancer, another brother with colon cancer, and multiple maternal uncles with unknown cancers.  Social history is non-contributory; denies alcohol, tobacco, recreational or illicit drug use.  Formerly worked as a Corporate treasurer. Lives in [place] .  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  Status post  prostatectomy in 2021 -- Status post chemotherapy and radiation treatments per patient.   -- Left sided POC intact.  -- States he is status post 3 treatments of Xofigo and is due for 3 additional treatments.  -- workup in the ED concerning for severe spinal stenosis. -- MRI 12/21/23 showed diffuse bone mets throughout lower cervical and upper thoracic spine, worse at C6-7 and felt to be epidural extension of metastatic disease.  -- Neurosurgery team with laminectomy/resection of tumor on 6/21 -- PT eval done -- May need Radiation Oncology eval -- Primary oncology at [facility] in [place]. Primary Oncologist Dr. Darroll [phone] (Info obtained from patient).  We do not give radium-223 here.  There is no indication for urgency to resume treatment so soon after surgery.  It appears to be working.  The patient can resume treatment upon return home to [place] .  If the patient's postoperative rehab course takes longer than several weeks, please consult radiation oncologist to give him radiation therapy to the spine  Anemia -- Hemoglobin low 8.4 -- Likely due to malignancy, recent cancer therapy -- Recommend PRBC transfusion for HGB<7.0.  No transfusion warranted at this time -- Continue to monitor CBC with diff  CKD -- elevated creatinine and BUN, last creatinine 1.60 and BUN 37 on 6/22 -- Avoid nephrotoxic agents -- continue hydration -- Monitor renal function  Generalized weakness -- secondary to malignancy -- Continue supportive care  MEDICAL HISTORY:  Past Medical History:  Diagnosis Date   BPH (benign prostatic hyperplasia)    Cancer (HCC)    metastatic prostate   Chronic kidney disease    stage 4   Heart murmur    Hypertension    Inguinal hernia    Nonrheumatic aortic (valve) stenosis  SURGICAL HISTORY: Past Surgical History:  Procedure Laterality Date   LYMPHADENECTOMY Bilateral 08/27/2019   Procedure: LYMPHADENECTOMY, PELVIC;  Surgeon: Renda Glance, MD;   Location: [facility];  Service: Urology;  Laterality: Bilateral;   POSTERIOR CERVICAL LAMINECTOMY N/A 12/21/2023   Procedure: POSTERIOR CERVICAL LAMINECTOMY;  Surgeon: Louis Shove, MD;  Location: [facility] OR;  Service: Neurosurgery;  Laterality: N/A;  Cervical Six - Throacic Two Decompressive Laminectomy With Resection Of Wpidural Tumor   ROBOT ASSISTED LAPAROSCOPIC RADICAL PROSTATECTOMY N/A 08/27/2019   Procedure: XI ROBOTIC ASSISTED LAPAROSCOPIC RADICAL PROSTATECTOMY LEVEL 2;  Surgeon: Renda Glance, MD;  Location: [facility];  Service: Urology;  Laterality: N/A;    SOCIAL HISTORY: Social History   Socioeconomic History   Marital status: Single    Spouse name: Not on file   Number of children: Not on file   Years of education: Not on file   Highest education level: Not on file  Occupational History   Not on file  Tobacco Use   Smoking status: Never   Smokeless tobacco: Never  Vaping Use   Vaping status: Never Used  Substance and Sexual Activity   Alcohol use: Never   Drug use: Never   Sexual activity: Not on file  Other Topics Concern   Not on file  Social History Narrative   Not on file   Social Drivers of Health   Financial Resource Strain: Not on file  Food Insecurity: No Food Insecurity (12/21/2023)   Hunger Vital Sign    Worried About Running Out of Food in the Last Year: Never true    Ran Out of Food in the Last Year: Never true  Transportation Needs: No Transportation Needs (12/21/2023)   PRAPARE - Administrator, Civil Service (Medical): No    Lack of Transportation (Non-Medical): No  Physical Activity: Not on file  Stress: Not on file  Social Connections: Moderately Integrated (12/21/2023)   Social Connection and Isolation Panel    Frequency of Communication with Friends and Family: Three times a week    Frequency of Social Gatherings with Friends and Family: Once a week    Attends Religious Services: 1 to 4 times per year    Active Member of [employer] or  Organizations: No    Attends Banker Meetings: 1 to 4 times per year    Marital Status: Divorced  Catering manager Violence: Not At Risk (12/21/2023)   Humiliation, Afraid, Rape, and Kick questionnaire    Fear of Current or Ex-Partner: No    Emotionally Abused: No    Physically Abused: No    Sexually Abused: No    FAMILY HISTORY: History reviewed. No pertinent family history.   PHYSICAL EXAMINATION: ECOG PERFORMANCE STATUS: 3 - Symptomatic, >50% confined to bed  Vitals:   12/24/23 1004 12/24/23 1158  BP: 130/76 (!) 151/84  Pulse: 76 74  Resp:    Temp:  97.6 F (36.4 C)  SpO2:  95%   Filed Weights   12/20/23 2325 12/21/23 0742  Weight: 200 lb 13.4 oz (91.1 kg) 200 lb 13.4 oz (91.1 kg)    GENERAL: alert, no distress and comfortable +left sided POC intact SKIN: skin color, texture, turgor are normal, no rashes or significant lesions EYES: normal, conjunctiva are pink and non-injected, sclera clear OROPHARYNX: no exudate, no erythema and lips, buccal mucosa, and tongue normal  NECK: supple, thyroid normal size, non-tender, without nodularity LYMPH: no palpable lymphadenopathy in the cervical, axillary or inguinal LUNGS: clear to auscultation and  percussion with normal breathing effort HEART: regular rate & rhythm and no murmurs and no lower extremity edema ABDOMEN: abdomen soft, non-tender and normal bowel sounds MUSCULOSKELETAL: +difficulty moving arms and legs  PSYCH: alert & oriented x 3 with fluent speech NEURO: no focal motor/sensory deficits   ALLERGIES:  is allergic to tramadol  and ramipril.  MEDICATIONS:  Current Facility-Administered Medications  Medication Dose Route Frequency Provider Last Rate Last Admin   acetaminophen  (TYLENOL ) tablet 650 mg  650 mg Oral Q6H PRN Louis Shove, MD       Or   acetaminophen  (TYLENOL ) suppository 650 mg  650 mg Rectal Q6H PRN Louis Shove, MD       allopurinol  (ZYLOPRIM ) tablet 100 mg  100 mg Oral BID Louis Shove,  MD   100 mg at 12/24/23 9045   amLODipine  (NORVASC ) tablet 10 mg  10 mg Oral Daily Louis Shove, MD   10 mg at 12/24/23 1004   bisacodyl  (DULCOLAX) EC tablet 5 mg  5 mg Oral Daily PRN Louis Shove, MD   5 mg at 12/23/23 1056   Chlorhexidine  Gluconate Cloth 2 % PADS 6 each  6 each Topical Daily Louis Shove, MD   6 each at 12/24/23 1002   dexamethasone  (DECADRON ) injection 4 mg  4 mg Intravenous Q6H Louis Shove, MD   4 mg at 12/24/23 1329   HYDROmorphone  (DILAUDID ) injection 0.5-1 mg  0.5-1 mg Intravenous Q2H PRN Louis Shove, MD   1 mg at 12/24/23 0953   insulin aspart (novoLOG) injection 0-5 Units  0-5 Units Subcutaneous QHS Kara Dorn NOVAK, MD   3 Units at 12/23/23 2136   insulin aspart (novoLOG) injection 0-9 Units  0-9 Units Subcutaneous TID WC Kara Dorn NOVAK, MD   3 Units at 12/24/23 1333   labetalol  (NORMODYNE ) injection 10 mg  10 mg Intravenous Q2H PRN Louis Shove, MD   10 mg at 12/21/23 9383   labetalol  (NORMODYNE ) tablet 200 mg  200 mg Oral BID Louis Shove, MD   200 mg at 12/24/23 1004   menthol-cetylpyridinium (CEPACOL) lozenge 3 mg  1 lozenge Oral PRN Louis Shove, MD       Or   phenol (CHLORASEPTIC) mouth spray 1 spray  1 spray Mouth/Throat PRN Louis Shove, MD       methocarbamol  (ROBAXIN ) injection 500 mg  500 mg Intravenous Q6H PRN Louis Shove, MD   500 mg at 12/24/23 1329   ondansetron  (ZOFRAN ) tablet 4 mg  4 mg Oral Q6H PRN Louis Shove, MD       Or   ondansetron  (ZOFRAN ) injection 4 mg  4 mg Intravenous Q6H PRN Louis Shove, MD       Oral care mouth rinse  15 mL Mouth Rinse PRN Louis Shove, MD       oxyCODONE  (Oxy IR/ROXICODONE ) immediate release tablet 5 mg  5 mg Oral Q4H PRN Louis Shove, MD   5 mg at 12/24/23 1335   pantoprazole  (PROTONIX ) EC tablet 40 mg  40 mg Oral BID Paytes, Austin A, RPH   40 mg at 12/24/23 0954   polyethylene glycol (MIRALAX  / GLYCOLAX ) packet 17 g  17 g Oral Daily Kara Dorn NOVAK, MD   17 g at 12/24/23 9046   senna-docusate (Senokot-S) tablet 1  tablet  1 tablet Oral QHS PRN Louis Shove, MD   1 tablet at 12/23/23 2118   sodium chloride  flush (NS) 0.9 % injection 3 mL  3 mL Intravenous Q12H Louis Shove, MD   3 mL at  12/24/23 1003   sodium chloride  flush (NS) 0.9 % injection 3 mL  3 mL Intravenous PRN Louis Shove, MD         LABORATORY DATA:  I have reviewed the data as listed Lab Results  Component Value Date   WBC 10.0 12/22/2023   HGB 8.4 (L) 12/22/2023   HCT 26.7 (L) 12/22/2023   MCV 106.8 (H) 12/22/2023   PLT 339 12/22/2023   Recent Labs    12/21/23 0214 12/22/23 0607  NA 138 136  K 4.4 4.9  CL 106 104  CO2 21* 21*  GLUCOSE 142* 149*  BUN 33* 37*  CREATININE 1.62* 1.60*  CALCIUM 8.8* 8.1*  GFRNONAA 47* 48*  PROT 6.4*  --   ALBUMIN 2.9*  --   AST 30  --   ALT 31  --   ALKPHOS 63  --   BILITOT <0.2  --     RADIOGRAPHIC STUDIES: I have personally reviewed the radiological images as listed and agreed with the findings in the report. DG Cervical Spine 2 or 3 views Result Date: 12/21/2023 CLINICAL DATA:  Cervical laminectomy EXAM: CERVICAL SPINE-single lateral view intraoperative COMPARISON:  Pre-surgical x-ray 07/09/2019 FINDINGS: Single portable lateral view obtained with surgical instruments along the posterior elements at C5-6. ET tube in place. Degenerative changes scattered along cervical spine on this limited view particularly along the lower cervical spine. C7 is not well seen. Imaging was obtained to aid in treatment. IMPRESSION: Intraoperative x-ray for surgical planning. Electronically Signed   By: Ranell Bring M.D.   On: 12/21/2023 11:28   MR CERVICAL SPINE W WO CONTRAST Result Date: 12/21/2023 CLINICAL DATA:  Metastatic prostate carcinoma. Cervical myelopathy, acute. EXAM: MRI CERVICAL SPINE WITHOUT AND WITH CONTRAST TECHNIQUE: Multiplanar and multiecho pulse sequences of the cervical spine, to include the craniocervical junction and cervicothoracic junction, were obtained without and with intravenous  contrast. CONTRAST:  9mL GADAVIST GADOBUTROL 1 MMOL/ML IV SOLN COMPARISON:  None Available. FINDINGS: Alignment: Physiologic.  Reversal of normal cervical lordosis. Vertebrae: There is abnormal low T1/high T2-weighted signal intensity throughout the lower cervical and upper thoracic spine (C5-T3) with associated abnormal contrast enhancement. Additionally, there is circumferential epidural contrast enhancement at the same levels, worst at C6-7 where there is effacement of the thecal sac and mass effect on the spinal cord. Cord: Epidural abnormality as above. Hyperintense T2-weighted signal within the spinal cord at the cervicothoracic junction. Posterior Fossa, vertebral arteries, paraspinal tissues: Negative Disc levels: C1-2: Unremarkable. C2-3: Disc bulge and bilateral uncovertebral hypertrophy with right-greater-than-left facet hypertrophy. There is no spinal canal stenosis. Severe right and moderate left neural foraminal stenosis. C3-4: Disc bulge with bilateral uncovertebral hypertrophy and left-greater-than-right facet hypertrophy. There is no spinal canal stenosis. Severe bilateral neural foraminal stenosis. C4-5: Left asymmetric disc bulge with left facet hypertrophy. There is no spinal canal stenosis. Severe left neural foraminal stenosis. C5-6: Intermediate sized disc osteophyte complex. Moderate spinal canal stenosis. Severe bilateral neural foraminal stenosis. Circumferential epidural soft tissue thickening effaces the thecal sac. C6-7: Intermediate sized disc bulge with uncovertebral hypertrophy. There is no spinal canal stenosis. No neural foraminal stenosis. Circumferential epidural soft tissue effaces the thecal sac. C7-T1: Normal disc space and facet joints. There is no spinal canal stenosis. No neural foraminal stenosis. Circumferential epidural soft tissue thickening effaces the thecal sac. IMPRESSION: 1. Diffuse osseous metastatic disease throughout the lower cervical and upper thoracic spine.  2. Circumferential epidural soft tissue thickening and enhancement at C5-T3, worst at C6-7 where there is  effacement of the thecal sac and mass effect on the spinal cord. This is favored to be epidural extension of metastatic disease. 3. 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 4. Moderate spinal canal stenosis at C5-6. 5. Severe neural foraminal stenosis at right C2-3, bilateral C3-4, left C4-5 and bilateral C5-6. Electronically Signed   By: Franky Stanford M.D.   On: 12/21/2023 01:52     The total time spent in the appointment was 55 minutes encounter with patients including review of chart and various tests results, discussions about plan of care and coordination of care plan   All questions were answered. The patient knows to call the clinic with any problems, questions or concerns. No barriers to learning was detected.  Olam JINNY Brunner, NP 6/24/20251:50 PM Almarie Bedford, MD

## 2023-12-24 NOTE — Progress Notes (Signed)
 Physical Therapy Treatment Patient Details Name: Kevin Wilson MRN: [id number] DOB: 04/24/1961 Today's Date: 12/24/2023   History of Present Illness Pt is a 63 y/o male presenting on 6/20 with weakness. Found with profound cervical myelopathy with increasing quadriparesis worrisome for severe spinal stenosis (metastatic per MRI). 6/21 S/P C5, C6, C7, T1, T2 laminectomy with resection of epidural tumor, microdissection. PMH includes: metastatic prostate carcinoma.    PT Comments  Pt received in supine and agreeable to session. Pt continues to require up to max A +2 for bed mobility due to weakness. Pt requires up to max A to maintain sitting balance, but demonstrates improved ability to hold trunk in neutral for increased time this session. Pt demonstrates increased difficulty righting leans requiring assist. Pt able to tolerate RLE exercises and attempted LLE exercise, but pt unable to produce movement. Pt continues to benefit from PT services to progress toward functional mobility goals.    If plan is discharge home, recommend the following: Two people to help with walking and/or transfers;Two people to help with bathing/dressing/bathroom;Assistance with cooking/housework;Assistance with feeding;Direct supervision/assist for medications management;Direct supervision/assist for financial management;Assist for transportation;Help with stairs or ramp for entrance   Can travel by private vehicle        Equipment Recommendations  Wheelchair (measurements PT);Wheelchair cushion (measurements PT);Hoyer lift    Recommendations for [employer]       Precautions / Restrictions Precautions Precautions: Cervical;Fall Recall of Precautions/Restrictions: Impaired Restrictions Weight Bearing Restrictions Per Provider Order: No     Mobility  Bed Mobility Overal bed mobility: Needs Assistance Bed Mobility: Rolling, Sidelying to Sit, Sit to Sidelying Rolling: Mod assist Sidelying to sit:  Max assist, +2 for physical assistance, HOB elevated, Used rails     Sit to sidelying: Max assist, +2 for physical assistance General bed mobility comments: Assist for BLE and trunk management. cues for sequencing and technique    Transfers                        Ambulation/Gait                   Stairs             Wheelchair Mobility     Tilt Bed    Modified Rankin (Stroke Patients Only)       Balance Overall balance assessment: Needs assistance Sitting-balance support: No upper extremity supported, Feet supported, Bilateral upper extremity supported Sitting balance-Leahy Scale: Poor Sitting balance - Comments: EOB with max assist, brief moments of min assist with B hands on knees. tends to loose balance towards L and posteriorly. Pt able to demonstrate improved ability to hold trunk in neutral once placed there, but requires assist to correct LOB and leans Postural control: Posterior lean, Left lateral lean     Standing balance comment: nt                            Communication Communication Communication: No apparent difficulties  Cognition Arousal: Alert Behavior During Therapy: WFL for tasks assessed/performed   PT - Cognitive impairments: No apparent impairments                         Following commands: Intact      Cueing Cueing Techniques: Verbal cues  Exercises General Exercises - Lower Extremity Long Arc Quad: AROM, Seated, Right, 5 reps Other Exercises Other Exercises: MARCILLE  towel slides forwards and backwards with RLE x5    General Comments        Pertinent Vitals/Pain Pain Assessment Pain Assessment: Faces Faces Pain Scale: Hurts whole lot Pain Location: neck Pain Descriptors / Indicators: Discomfort, Operative site guarding, Grimacing Pain Intervention(s): Limited activity within patient's tolerance, Monitored during session, Repositioned     PT Goals (current goals can now be found in  the care plan section) Acute Rehab PT Goals Patient Stated Goal: to regain strength and independence PT Goal Formulation: With patient Time For Goal Achievement: 01/05/24 Progress towards PT goals: Progressing toward goals    Frequency    Min 3X/week       AM-PAC PT 6 Clicks Mobility   Outcome Measure  Help needed turning from your back to your side while in a flat bed without using bedrails?: Total Help needed moving from lying on your back to sitting on the side of a flat bed without using bedrails?: Total Help needed moving to and from a bed to a chair (including a wheelchair)?: Total Help needed standing up from a chair using your arms (e.g., wheelchair or bedside chair)?: Total Help needed to walk in hospital room?: Total Help needed climbing 3-5 steps with a railing? : Total 6 Click Score: 6    End of Session   Activity Tolerance: Patient tolerated treatment well Patient left: in bed;with call bell/phone within reach;with bed alarm set Nurse Communication: Mobility status PT Visit Diagnosis: Unsteadiness on feet (R26.81);Other abnormalities of gait and mobility (R26.89);History of falling (Z91.81);Muscle weakness (generalized) (M62.81)     Time: 9062-9041 PT Time Calculation (min) (ACUTE ONLY): 21 min  Charges:    $Therapeutic Activity: 8-22 mins PT General Charges $$ ACUTE PT VISIT: 1 Visit                     Darryle Keymarion, PTA Acute Rehabilitation Services Secure Chat Preferred  [phone]    Darryle Jadarian 12/24/2023, 12:35 PM

## 2023-12-24 NOTE — Progress Notes (Signed)
 No new issues or problems.  Patient's neck pain well-controlled.  States he has a little bit more feeling in his hands and legs.  Minimal change in his motor examination.  Continues to void well.  He is awake and alert.  He is oriented and appropriate.  Cranial nerve function is intact.  Motor examination reveals good proximal strength with normal biceps and deltoid muscle strength.  He has good wrist extensor function bilaterally.  He has 4/5 strength in his right triceps and 4-/5 strength in his left triceps.  He has 2-3/5 strength in his right grip and trace function in his right intrinsics of his hand.  He has 1-2/5 strength in his left grip and minimal intrinsic function.  He has 3/5 in his right lower extremity and 2-3/5 in his left lower extremity.  He has no dorsiflexion.  He has minimal plantarflexion on the left decent plantarflexion on the right.  His wound is clean and dry.  His chest and abdomen is benign.  Patient with metastatic prostate cancer with spread to his cervical spine with epidural tumor causing marked cord compression.  Patient's status post emergent cervical decompressive surgery with resection of his dorsal epidural tumor.  He continues to have ventral epidural tumor which is not resectable.  He is improved slightly from surgery but has not made a dramatic improvement.  I discussed situation with patient's treating oncologist in [place] .  She states that he has received chemotherapy to their limits in [place] and that additional medications are not available to them at their small oncology center.  She recommends that I contact oncology here for further evaluation and possible treatment protocol.  The patient will also require adjunctive radiation treatment to his cervical spine.  Usually would like to wait 2 weeks from surgery before beginning this.  I will contact our oncology group here and await their recommendations.  Continue efforts at therapy and  rehabilitation.

## 2023-12-25 LAB — GLUCOSE, CAPILLARY
Glucose-Capillary: 175 mg/dL — ABNORMAL HIGH (ref 70–99)
Glucose-Capillary: 115 mg/dL — ABNORMAL HIGH (ref 70–99)
Glucose-Capillary: 150 mg/dL — ABNORMAL HIGH (ref 70–99)
Glucose-Capillary: 174 mg/dL — ABNORMAL HIGH (ref 70–99)

## 2023-12-25 LAB — TYPE AND SCREEN
ABO/RH(D): O POS
Antibody Screen: NEGATIVE
Unit division: 0

## 2023-12-25 LAB — BPAM RBC
Blood Product Expiration Date: 202507112359
ISSUE DATE / TIME: 202506210913
Unit Type and Rh: 5100

## 2023-12-25 LAB — SURGICAL PATHOLOGY

## 2023-12-25 NOTE — [place] FL2 (Signed)
 [place]  MEDICAID FL2 LEVEL OF CARE FORM     IDENTIFICATION  Patient Name: Kevin Wilson Birthdate: Jul 18, 1960 Sex: male Admission Date (Current Location): 12/20/2023  [facility] and [place] Number:   ([place] )   Facility and Address:  [facility], [address], [place], [place] [address]      Provider Number: [id number]  Attending Physician Name and Address:  Louis Shove, MD  Relative Name and Phone Number:       Current Level of Care: Hospital Recommended Level of Care: Skilled Nursing Facility Prior Approval Number:    Date Approved/Denied:   PASRR Number:    Discharge Plan: SNF    Current Diagnoses: Patient Active Problem List   Diagnosis Date Noted   Cervical myelopathy (HCC) 12/20/2023   Prostate cancer (HCC) 08/27/2019    Orientation RESPIRATION BLADDER Height & Weight     Self, Time, Situation, Place  Normal Continent Weight: 200 lb 13.4 oz (91.1 kg) Height:  5' 7.99 (172.7 cm)  BEHAVIORAL SYMPTOMS/MOOD NEUROLOGICAL BOWEL NUTRITION STATUS      Continent Diet (regular)  AMBULATORY STATUS COMMUNICATION OF NEEDS Skin   Extensive Assist Verbally Surgical wounds (closed neck, staples and honeycomb dressing)                       Personal Care Assistance Level of Assistance  Bathing, Feeding, Dressing Bathing Assistance: Maximum assistance Feeding assistance: Limited assistance Dressing Assistance: Maximum assistance     Functional Limitations Info  Sight Sight Info: Impaired        SPECIAL CARE FACTORS FREQUENCY  PT (By licensed PT), OT (By licensed OT)     PT Frequency: 5x/wk OT Frequency: 5x/wk            Contractures Contractures Info: Not present    Additional Factors Info  Code Status, Allergies Code Status Info: Full Allergies Info: Tramadol , Ramipril           Current Medications (12/25/2023):  This is the current hospital active medication list Current Facility-Administered  Medications  Medication Dose Route Frequency Provider Last Rate Last Admin   acetaminophen  (TYLENOL ) tablet 650 mg  650 mg Oral Q6H PRN Louis Shove, MD       Or   acetaminophen  (TYLENOL ) suppository 650 mg  650 mg Rectal Q6H PRN Louis Shove, MD       allopurinol  (ZYLOPRIM ) tablet 100 mg  100 mg Oral BID Louis Shove, MD   100 mg at 12/25/23 0900   amLODipine  (NORVASC ) tablet 10 mg  10 mg Oral Daily Louis Shove, MD   10 mg at 12/25/23 0900   bisacodyl  (DULCOLAX) EC tablet 5 mg  5 mg Oral Daily PRN Louis Shove, MD   5 mg at 12/23/23 1056   Chlorhexidine  Gluconate Cloth 2 % PADS 6 each  6 each Topical Daily Louis Shove, MD   6 each at 12/25/23 0900   dexamethasone  (DECADRON ) injection 4 mg  4 mg Intravenous Q6H Louis Shove, MD   4 mg at 12/25/23 1231   HYDROmorphone  (DILAUDID ) injection 0.5-1 mg  0.5-1 mg Intravenous Q2H PRN Louis Shove, MD   1 mg at 12/24/23 1734   insulin aspart (novoLOG) injection 0-5 Units  0-5 Units Subcutaneous QHS Kara Carrier B, MD   4 Units at 12/24/23 2245   insulin aspart (novoLOG) injection 0-9 Units  0-9 Units Subcutaneous TID WC Kara Carrier NOVAK, MD   2 Units at 12/25/23 9346   labetalol  (NORMODYNE ) injection  10 mg  10 mg Intravenous Q2H PRN Louis Shove, MD   10 mg at 12/21/23 9383   labetalol  (NORMODYNE ) tablet 200 mg  200 mg Oral BID Louis Shove, MD   200 mg at 12/25/23 0900   menthol-cetylpyridinium (CEPACOL) lozenge 3 mg  1 lozenge Oral PRN Louis Shove, MD       Or   phenol (CHLORASEPTIC) mouth spray 1 spray  1 spray Mouth/Throat PRN Louis Shove, MD       methocarbamol  (ROBAXIN ) injection 500 mg  500 mg Intravenous Q6H PRN Louis Shove, MD   500 mg at 12/24/23 1329   ondansetron  (ZOFRAN ) tablet 4 mg  4 mg Oral Q6H PRN Louis Shove, MD       Or   ondansetron  (ZOFRAN ) injection 4 mg  4 mg Intravenous Q6H PRN Louis Shove, MD       Oral care mouth rinse  15 mL Mouth Rinse PRN Louis Shove, MD       oxyCODONE  (Oxy IR/ROXICODONE ) immediate release tablet 5 mg  5 mg  Oral Q4H PRN Louis Shove, MD   5 mg at 12/25/23 1314   pantoprazole  (PROTONIX ) EC tablet 40 mg  40 mg Oral BID Paytes, Austin A, RPH   40 mg at 12/25/23 0900   polyethylene glycol (MIRALAX  / GLYCOLAX ) packet 17 g  17 g Oral Daily Kara Dorn NOVAK, MD   17 g at 12/25/23 0900   senna-docusate (Senokot-S) tablet 1 tablet  1 tablet Oral QHS PRN Louis Shove, MD   1 tablet at 12/23/23 2118   sodium chloride  flush (NS) 0.9 % injection 10-40 mL  10-40 mL Intracatheter Q12H Louis Shove, MD   10 mL at 12/25/23 0900   sodium chloride  flush (NS) 0.9 % injection 10-40 mL  10-40 mL Intracatheter PRN Louis Shove, MD       sodium chloride  flush (NS) 0.9 % injection 3 mL  3 mL Intravenous Q12H Louis Shove, MD   3 mL at 12/25/23 0900   sodium chloride  flush (NS) 0.9 % injection 3 mL  3 mL Intravenous PRN Louis Shove, MD         Discharge Medications: Please see discharge summary for a list of discharge medications.  Relevant Imaging Results:  Relevant Lab Results:   Additional Information SS#: [id number]  Almarie CHRISTELLA Goodie, [place]

## 2023-12-25 NOTE — Progress Notes (Signed)
 Occupational Therapy Treatment Patient Details Name: Kevin Wilson MRN: [id number] DOB: 24-Aug-1960 Today's Date: 12/25/2023   History of present illness Pt is a 63 y/o male presenting on 6/20 with weakness. Found with profound cervical myelopathy with increasing quadriparesis worrisome for severe spinal stenosis (metastatic per MRI). 6/21 S/P C5, C6, C7, T1, T2 laminectomy with resection of epidural tumor, microdissection. PMH includes: metastatic prostate carcinoma.   OT comments  Pt supine in bed and declined OOB this session due to fatigue.  Worked on hand exercises, isolating wrist extension and finger flexion/extension, as well as bilateral coordination using yellow squeeze foam.  Patient provided with extra foam handle for toothbrush to increase ease with ADL task.  Pt reports working on Ues frequently during the day, encouraged stretching Ues in tenodesis pattern or just resting hands in extension to avoid overstretching depending on return of function- pt agreeable.  Will continue to monitor for splinting needs, updated dc plan to <3hrs/day per rehab signing off.         If plan is discharge home, recommend the following:  Two people to help with walking and/or transfers;A lot of help with bathing/dressing/bathroom;Direct supervision/assist for medications management;Direct supervision/assist for financial management;Assist for transportation;Help with stairs or ramp for entrance;Assistance with feeding;Assistance with cooking/housework   Equipment Recommendations  Other (comment) (defer)    Recommendations for Other Services      Precautions / Restrictions Precautions Precautions: Cervical;Fall Precaution Booklet Issued: No Recall of Precautions/Restrictions: Impaired Precaution/Restrictions Comments: requires cueing for cervical precautions during session Restrictions Weight Bearing Restrictions Per Provider Order: No       Mobility Bed Mobility                General bed mobility comments: pt declines EOB this session due to fatigue    Transfers                         Balance                                           ADL either performed or assessed with clinical judgement   ADL                                         General ADL Comments: pt provided with extra red foam handle for toothbrush- reports difficulty with brushing teeth this am. Limited session as pt declines OOB due to fatigue from not sleeping well overnight.    Extremity/Trunk Assessment Upper Extremity Assessment Upper Extremity Assessment: RUE deficits/detail;LUE deficits/detail RUE Deficits / Details: grossly weak, 3/5 MMT shoulder, elbow, wrist, forearm. Able to grasp 3-/5, holds utensils with red foam handle.  Increased hand function with strength and grasp and hand extension. RUE Sensation: WNL RUE Coordination: decreased fine motor;decreased gross motor LUE Deficits / Details: grossly weak, 3/5 MMT shoulder, elbow, wrist, forearm. Able to grasp 3-/5 and limited finger extension. Minimal finger flexion/extension with wrist stabilized (less than R UE) LUE Sensation: WNL LUE Coordination: decreased fine motor;decreased gross motor            Vision   Vision Assessment?: No apparent visual deficits   Perception     Praxis     Communication Communication Communication: No apparent difficulties   Cognition Arousal:  Alert Behavior During Therapy: WFL for tasks assessed/performed Cognition: No apparent impairments                               Following commands: Intact        Cueing   Cueing Techniques: Verbal cues  Exercises Exercises: General Upper Extremity, Other exercises General Exercises - Upper Extremity Wrist Flexion: AROM, 10 reps, Both, Supine Wrist Extension: AROM, Both, 10 reps, Supine Digit Composite Flexion: AROM, Both, 10 reps, Supine Composite Extension: AROM, Both, 10  reps, Supine Other Exercises Other Exercises: Provided yellow squeeze foam and worked on squeezing foam then passing between hands.    Shoulder Instructions       General Comments      Pertinent Vitals/ Pain       Pain Assessment Pain Assessment: Faces Faces Pain Scale: Hurts a little bit Pain Location: neck Pain Descriptors / Indicators: Discomfort, Operative site guarding, Grimacing Pain Intervention(s): Limited activity within patient's tolerance, Monitored during session, Repositioned  Home Living                                          Prior Functioning/Environment              Frequency  Min 2X/week        Progress Toward Goals  OT Goals(current goals can now be found in the care plan section)  Progress towards OT goals: Progressing toward goals (slowly)  Acute Rehab OT Goals Patient Stated Goal: rehab OT Goal Formulation: With patient Time For Goal Achievement: 01/05/24 Potential to Achieve Goals: Good  Plan      Co-evaluation                 AM-PAC OT 6 Clicks Daily Activity     Outcome Measure   Help from another person eating meals?: A Little Help from another person taking care of personal grooming?: A Little Help from another person toileting, which includes using toliet, bedpan, or urinal?: Total Help from another person bathing (including washing, rinsing, drying)?: A Lot Help from another person to put on and taking off regular upper body clothing?: A Lot Help from another person to put on and taking off regular lower body clothing?: Total 6 Click Score: 12    End of Session    OT Visit Diagnosis: Other abnormalities of gait and mobility (R26.89);Muscle weakness (generalized) (M62.81);Other symptoms and signs involving the nervous system (R29.898)   Activity Tolerance Patient tolerated treatment well;Patient limited by fatigue   Patient Left in bed;with call bell/phone within reach;with bed alarm set;with  SCD's reapplied (PT present)   Nurse Communication Mobility status        Time: 8958-8943 OT Time Calculation (min): 15 min  Charges: OT General Charges $OT Visit: 1 Visit OT Treatments $Neuromuscular Re-education: 8-22 mins  Etta NOVAK, OT Acute Rehabilitation Services Office [phone] Secure Chat Preferred    Etta GORMAN Hope 12/25/2023, 12:27 PM

## 2023-12-25 NOTE — Progress Notes (Signed)
 Physical Therapy Treatment Patient Details Name: Kevin Wilson MRN: [id number] DOB: 09-30-1960 Today's Date: 12/25/2023   History of Present Illness Pt is a 63 y/o male presenting on 6/20 with weakness. Found with profound cervical myelopathy with increasing quadriparesis worrisome for severe spinal stenosis (metastatic per MRI). 6/21 S/P C5, C6, C7, T1, T2 laminectomy with resection of epidural tumor, microdissection. PMH includes: metastatic prostate carcinoma.    PT Comments  Pt received in supine and declines EOB this session due to fatigue, but is agreeable to supine exercises. Pt demonstrates no activation of LLE despite attempts. Pt demonstrates some quad activation during RLE exercsies, but requires assist to complete and is unable to hold eccentrically during heel slides. Pt able to tolerate PROM and manual calf stretches. Pt continues to benefit from PT services to progress toward functional mobility goals.    If plan is discharge home, recommend the following: Two people to help with walking and/or transfers;Two people to help with bathing/dressing/bathroom;Assistance with cooking/housework;Assistance with feeding;Direct supervision/assist for medications management;Direct supervision/assist for financial management;Assist for transportation;Help with stairs or ramp for entrance   Can travel by private vehicle        Equipment Recommendations  Wheelchair (measurements PT);Wheelchair cushion (measurements PT);Hoyer lift    Recommendations for [employer]       Precautions / Restrictions Precautions Precautions: Cervical;Fall Recall of Precautions/Restrictions: Impaired Restrictions Weight Bearing Restrictions Per Provider Order: No     Mobility  Bed Mobility               General bed mobility comments: pt declines EOB this session due to fatigue    Transfers                        Ambulation/Gait                   Stairs              Wheelchair Mobility     Tilt Bed    Modified Rankin (Stroke Patients Only)       Balance       Sitting balance - Comments: pt deferred EOB this session                                    Communication Communication Communication: No apparent difficulties  Cognition Arousal: Alert Behavior During Therapy: WFL for tasks assessed/performed   PT - Cognitive impairments: No apparent impairments                         Following commands: Intact      Cueing Cueing Techniques: Verbal cues  Exercises General Exercises - Lower Extremity Quad Sets: AROM, Right, 5 reps, Supine Heel Slides: AAROM, Supine, Right, 5 reps Other Exercises Other Exercises: PROM B ankles Other Exercises: B manual calf stretch x3    General Comments        Pertinent Vitals/Pain Pain Assessment Pain Assessment: Faces Faces Pain Scale: Hurts little more Pain Location: neck Pain Descriptors / Indicators: Discomfort, Operative site guarding, Grimacing Pain Intervention(s): Monitored during session, Repositioned     PT Goals (current goals can now be found in the care plan section) Acute Rehab PT Goals Patient Stated Goal: to regain strength and independence PT Goal Formulation: With patient Time For Goal Achievement: 01/05/24 Progress towards PT goals: Progressing toward goals (slowly)  Frequency    Min 3X/week       AM-PAC PT 6 Clicks Mobility   Outcome Measure  Help needed turning from your back to your side while in a flat bed without using bedrails?: Total Help needed moving from lying on your back to sitting on the side of a flat bed without using bedrails?: Total Help needed moving to and from a bed to a chair (including a wheelchair)?: Total Help needed standing up from a chair using your arms (e.g., wheelchair or bedside chair)?: Total Help needed to walk in hospital room?: Total Help needed climbing 3-5 steps with a railing? : Total 6  Click Score: 6    End of Session   Activity Tolerance: Patient limited by fatigue Patient left: in bed;with call bell/phone within reach Nurse Communication: Mobility status PT Visit Diagnosis: Unsteadiness on feet (R26.81);Other abnormalities of gait and mobility (R26.89);History of falling (Z91.81);Muscle weakness (generalized) (M62.81)     Time: 8944-8891 PT Time Calculation (min) (ACUTE ONLY): 13 min  Charges:    $Therapeutic Exercise: 8-22 mins PT General Charges $$ ACUTE PT VISIT: 1 Visit                     Darryle Jozef, PTA Acute Rehabilitation Services Secure Chat Preferred  [phone]    Darryle Terris 12/25/2023, 12:11 PM

## 2023-12-25 NOTE — Plan of Care (Signed)
 Comfortably resting throughout the night, no complain of any pain, no significant changes happen overnight. Problem: Pain Managment: Goal: General experience of comfort will improve and/or be controlled Outcome: Progressing   Problem: Safety: Goal: Ability to remain free from injury will improve Outcome: Progressing   Problem: Skin Integrity: Goal: Risk for impaired skin integrity will decrease Outcome: Progressing   Problem: Urinary Elimination: Goal: Ability to achieve a regular elimination pattern will improve Outcome: Progressing   Problem: Coping: Goal: Ability to adjust to condition or change in health will improve Outcome: Progressing

## 2023-12-25 NOTE — Progress Notes (Signed)
 Postop day 3.  Patient's neck pain reasonably well-controlled.  No radiating pain.  Patient still without bowel movement.  Awake and alert.  Oriented and appropriate.  Cranial nerve function normal by.  Motor and sensory function of his upper and lower extremities stable.  Wound clean and dry.  Chest and abdomen benign.  Patient with widely metastatic prostate carcinoma undergoing chemotherapy and prior radiation to his shoulders and lumbar spine.  Patient now with metastatic disease to his cervical spine with epidural spread and resultant severe cervical myelopathy.  Patient status post decompressive surgery and resection of dorsal epidural tumor.  He will require adjunctive radiotherapy over the next couple weeks.  Oncology does not feel that any further chemotherapy is appropriate in the short-term and recommend following up with his oncologist in [place] once discharged.

## 2023-12-25 NOTE — Plan of Care (Signed)
  Problem: Education: Goal: Knowledge of the prescribed therapeutic regimen will improve Outcome: Progressing   Problem: Self-Care: Goal: Ability to participate in self-care as condition permits will improve Outcome: Progressing

## 2023-12-26 LAB — GLUCOSE, CAPILLARY
Glucose-Capillary: 203 mg/dL — ABNORMAL HIGH (ref 70–99)
Glucose-Capillary: 155 mg/dL — ABNORMAL HIGH (ref 70–99)
Glucose-Capillary: 162 mg/dL — ABNORMAL HIGH (ref 70–99)
Glucose-Capillary: 228 mg/dL — ABNORMAL HIGH (ref 70–99)

## 2023-12-26 NOTE — Progress Notes (Signed)
   Providing Compassionate, Quality Care - Together   Subjective: Patient reports no new issues.  Objective: Vital signs in last 24 hours: Temp:  [97.5 F (36.4 C)-98.1 F (36.7 C)] 98.1 F (36.7 C) (06/26 0755) Pulse Rate:  [66-76] 71 (06/26 0755) Resp:  [18] 18 (06/26 0755) BP: (143-156)/(74-90) 155/90 (06/26 0755) SpO2:  [94 %-98 %] 96 % (06/26 0755)  Intake/Output from previous day: 06/25 0701 - 06/26 0700 In: 780 [P.O.:780] Out: 3090 [Urine:3090] Intake/Output this shift: No intake/output data recorded.  Alert and oriented x 4 PERRLA CN II-XII grossly intact MAE, motor and sensory function in BUE and BLE stable Decreased grip and fine motor Incision is covered with Honeycomb dressing and Steri Strips; Dressing is clean, dry, and intact   Lab Results: No results for input(s): WBC, HGB, HCT, PLT in the last 72 hours. BMET No results for input(s): NA, K, CL, CO2, GLUCOSE, BUN, CREATININE, CALCIUM in the last 72 hours.  Studies/Results: No results found.  Assessment/Plan: Patient with metastatic prostate cancer. He underwent C5, C6, C7, T1, T2 laminectomy with resection of epidural tumor by Dr. Louis on 12/21/2023. He will follow up with his oncologist in [place] once discharged from [facility].    LOS: 6 days   -Continue supportive efforts. -Plan is for discharge to SNF for further rehabilitation.  I am in communication with my attending and they agree with the plan for this patient.   Gerard Beck, DNP, AGNP-C Nurse Practitioner  [facility] [address], [address], [place], [place] [address] P: [phone]    F: [phone]  12/26/2023, 12:09 PM

## 2023-12-26 NOTE — Plan of Care (Signed)
  Problem: Safety: Goal: Ability to remain free from injury will improve Outcome: Progressing   Problem: Skin Integrity: Goal: Risk for impaired skin integrity will decrease Outcome: Progressing   Problem: Education: Goal: Knowledge of disease or condition will improve Outcome: Progressing   Problem: Skin Integrity: Goal: Risk for impaired skin integrity will decrease Outcome: Progressing   Problem: Urinary Elimination: Goal: Ability to achieve a regular elimination pattern will improve Outcome: Progressing   Problem: Pain Management: Goal: Pain level will decrease Outcome: Progressing

## 2023-12-26 NOTE — Plan of Care (Signed)
 Resting comfortably in the bed.  Denies pain.  Has not had a BM in several days and received Miralax  and senna.  Says he does not want me to call to get a suppository or an enema because he feels like he can go today.  Is passing gas.    Problem: Safety: Goal: Ability to remain free from injury will improve Outcome: Progressing   Problem: Coping: Goal: Ability to identify and develop effective coping behavior will improve Outcome: Progressing   Problem: Activity: Goal: Will remain free from falls Outcome: Progressing   Problem: Skin Integrity: Goal: Risk for impaired skin integrity will decrease Outcome: Progressing

## 2023-12-26 NOTE — TOC Progression Note (Signed)
 Transition of Care [facility]) - Progression Note    Patient Details  Name: Jevaun Strick MRN: [id number] Date of Birth: 03-May-1961  [facility]) CM/SW Contact  Almarie CHRISTELLA Goodie, [place] Phone Number: 12/26/2023, 8:06 PM  Clinical Narrative:   CSW contacted [facility] to discuss referral and insurance coverage. [facility] reviewed referral, they are able to accept the patient; he will have [facility] Medicare begin on 7/1, and they can accept him with Medicaid prior to that. Plan for patient to admit to [facility] tomorrow if medically stable. CSW to follow.    Expected Discharge Plan: Skilled Nursing Facility    Expected Discharge Plan and Services                                               Social Determinants of Health (SDOH) Interventions SDOH Screenings   Food Insecurity: No Food Insecurity (12/21/2023)  Housing: Low Risk  (12/21/2023)  Transportation Needs: No Transportation Needs (12/21/2023)  Utilities: Not At Risk (12/21/2023)  Social Connections: Moderately Integrated (12/21/2023)  Tobacco Use: Low Risk  (12/21/2023)    Readmission Risk Interventions     No data to display

## 2023-12-27 LAB — GLUCOSE, CAPILLARY
Glucose-Capillary: 193 mg/dL — ABNORMAL HIGH (ref 70–99)
Glucose-Capillary: 155 mg/dL — ABNORMAL HIGH (ref 70–99)
Glucose-Capillary: 170 mg/dL — ABNORMAL HIGH (ref 70–99)

## 2023-12-27 MED ORDER — OXYCODONE HCL 5 MG PO TABS: 5.0000 mg | ORAL_TABLET | ORAL | 0 refills | Status: AC | PRN

## 2023-12-27 MED ORDER — SENNOSIDES-DOCUSATE SODIUM 8.6-50 MG PO TABS
1.0000 | ORAL_TABLET | Freq: Every evening | ORAL | Status: AC | PRN
Start: 2023-12-27 — End: ?

## 2023-12-27 MED ORDER — METHOCARBAMOL 1000 MG/10ML IJ SOLN: 500.0000 mg | Freq: Four times a day (QID) | INTRAMUSCULAR | Status: AC | PRN

## 2023-12-27 MED ORDER — METHYLPREDNISOLONE 4 MG PO TBPK: ORAL_TABLET | ORAL | Status: AC

## 2023-12-27 MED ORDER — POLYETHYLENE GLYCOL 3350 17 G PO PACK: 17.0000 g | PACK | Freq: Every day | ORAL | Status: AC

## 2023-12-27 NOTE — TOC Transition Note (Signed)
 Transition of Care [facility]) - Discharge Note   Patient Details  Name: Kevin Wilson MRN: [id number] Date of Birth: 10/19/1960  [facility]) CM/SW Contact:  Almarie CHRISTELLA Goodie, LCSW Phone Number: 12/27/2023, 2:32 PM   Clinical Narrative:   CSW spoke with daughter, Winnie, to discuss discharge to [facility] today, she is in agreement. CSW updated MD, patient is medically stable. Discharge information sent to [facility], confirmed receipt. Transport arranged with [facility] for next available.  Nurse to call report to [phone], Room 230B.    Final next level of care: Skilled Nursing Facility Barriers to Discharge: Barriers Resolved   Patient Goals and CMS Choice            Discharge Placement              Patient chooses bed at: [facility] Patient to be transferred to facility by: [facility] Name of family member notified: Chantea Patient and family notified of of transfer: 12/27/23  Discharge Plan and Services Additional resources added to the After Visit Summary for                                       Social Drivers of Health (SDOH) Interventions SDOH Screenings   Food Insecurity: No Food Insecurity (12/21/2023)  Housing: Low Risk  (12/21/2023)  Transportation Needs: No Transportation Needs (12/21/2023)  Utilities: Not At Risk (12/21/2023)  Social Connections: Moderately Integrated (12/21/2023)  Tobacco Use: Low Risk  (12/21/2023)     Readmission Risk Interventions     No data to display

## 2023-12-27 NOTE — Progress Notes (Signed)
 PT left with EMS. Stable and all belongings with pt.

## 2023-12-27 NOTE — Discharge Instructions (Signed)
 Wound Care You may remove the Honeycomb dressing once discharged. Leave steri-strips on back of neck.  They will fall off by themselves. Do not put any creams, lotions, or ointments on incision. You are fine to shower. Let water run over incision and pat dry.  Activity Walk each and every day, increasing distance each day. No lifting greater than 5 lbs.  Avoid excessive neck motion. No driving for 2 weeks; may ride as a passenger locally.  Diet Resume your normal diet.   Return to Work Will be discussed at your follow up appointment.  Call Your Doctor If Any of These Occur Redness, drainage, or swelling at the wound.  Temperature greater than 101 degrees. Severe pain not relieved by pain medication. Incision starts to come apart.  Follow Up Appt Call [phone] today for appointment in 4 weeks if you don't already have one or for any problems.

## 2023-12-27 NOTE — Discharge Summary (Signed)
 Physician Discharge Summary     Providing Compassionate, Quality Care - Together   Patient ID: Kevin Wilson MRN: [id number] DOB/AGE: 65-Mar-1962 65 y.o.  Admit date: 12/20/2023 Discharge date: 12/27/2023  Admission Diagnoses: Cervical myelopathy  Discharge Diagnoses:  Principal Problem:   Cervical myelopathy [facility])   Discharged Condition: good  Hospital Course: Patient underwent a C5, C6, C7, T1, T2 laminectomy with resection of epidural tumor by Dr. Louis on 12/21/2023. He was admitted to 3W22 following recovery from anesthesia in the PACU. His postoperative course has been uncomplicated. He has worked with both physical and occupational therapies who feel the patient is ready for discharge to a skilled nursing facility for further rehabilitation. He is tolerating a normal diet. He has a urethral catheter. His pain is well-controlled with oral pain medication. He is ready for discharge to the SNF. He will follow up with his oncologist in [place]  for treatment of his metastatic prostate cancer.   Consults: PT/OT/TOC  Significant Diagnostic Studies: radiology: DG Cervical Spine 2 or 3 views Result Date: 12/21/2023 CLINICAL DATA:  Cervical laminectomy EXAM: CERVICAL SPINE-single lateral view intraoperative COMPARISON:  Pre-surgical x-ray 07/09/2019 FINDINGS: Single portable lateral view obtained with surgical instruments along the posterior elements at C5-6. ET tube in place. Degenerative changes scattered along cervical spine on this limited view particularly along the lower cervical spine. C7 is not well seen. Imaging was obtained to aid in treatment. IMPRESSION: Intraoperative x-ray for surgical planning. Electronically Signed   By: Ranell Bring M.D.   On: 12/21/2023 11:28   MR CERVICAL SPINE W WO CONTRAST Result Date: 12/21/2023 CLINICAL DATA:  Metastatic prostate carcinoma. Cervical myelopathy, acute. EXAM: MRI CERVICAL SPINE WITHOUT AND WITH CONTRAST TECHNIQUE: Multiplanar and  multiecho pulse sequences of the cervical spine, to include the craniocervical junction and cervicothoracic junction, were obtained without and with intravenous contrast. CONTRAST:  9mL GADAVIST  GADOBUTROL  1 MMOL/ML IV SOLN COMPARISON:  None Available. FINDINGS: Alignment: Physiologic.  Reversal of normal cervical lordosis. Vertebrae: There is abnormal low T1/high T2-weighted signal intensity throughout the lower cervical and upper thoracic spine (C5-T3) with associated abnormal contrast enhancement. Additionally, there is circumferential epidural contrast enhancement at the same levels, worst at C6-7 where there is effacement of the thecal sac and mass effect on the spinal cord. Cord: Epidural abnormality as above. Hyperintense T2-weighted signal within the spinal cord at the cervicothoracic junction. Posterior Fossa, vertebral arteries, paraspinal tissues: Negative Disc levels: C1-2: Unremarkable. C2-3: Disc bulge and bilateral uncovertebral hypertrophy with right-greater-than-left facet hypertrophy. There is no spinal canal stenosis. Severe right and moderate left neural foraminal stenosis. C3-4: Disc bulge with bilateral uncovertebral hypertrophy and left-greater-than-right facet hypertrophy. There is no spinal canal stenosis. Severe bilateral neural foraminal stenosis. C4-5: Left asymmetric disc bulge with left facet hypertrophy. There is no spinal canal stenosis. Severe left neural foraminal stenosis. C5-6: Intermediate sized disc osteophyte complex. Moderate spinal canal stenosis. Severe bilateral neural foraminal stenosis. Circumferential epidural soft tissue thickening effaces the thecal sac. C6-7: Intermediate sized disc bulge with uncovertebral hypertrophy. There is no spinal canal stenosis. No neural foraminal stenosis. Circumferential epidural soft tissue effaces the thecal sac. C7-T1: Normal disc space and facet joints. There is no spinal canal stenosis. No neural foraminal stenosis. Circumferential  epidural soft tissue thickening effaces the thecal sac. IMPRESSION: 1. Diffuse osseous metastatic disease throughout the lower cervical and upper thoracic spine. 2. Circumferential epidural soft tissue thickening and enhancement at C5-T3, worst at C6-7 where there is effacement of the thecal sac and mass  effect on the spinal cord. This is favored to be epidural extension of metastatic disease. 3. 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 4. Moderate spinal canal stenosis at C5-6. 5. Severe neural foraminal stenosis at right C2-3, bilateral C3-4, left C4-5 and bilateral C5-6. Electronically Signed   By: Franky Stanford M.D.   On: 12/21/2023 01:52     Treatments: surgery: C5, C6, C7, T1, T2 laminectomy with resection of epidural tumor, microdissection   Discharge Exam: Blood pressure (!) 144/86, pulse 72, temperature 97.7 F (36.5 C), temperature source Oral, resp. rate 16, height 5' 7.99 (1.727 m), weight 91.1 kg, SpO2 99%.  Alert and oriented x 4 PERRLA CN II-XII grossly intact MAE, motor and sensory function in BUE and BLE stable Decreased grip and fine motor Incision is covered with Honeycomb dressing and Steri Strips; Dressing is clean, dry, and intact  Disposition: Discharge disposition: 03-Skilled Nursing Facility       Discharge Instructions     Call MD for:  difficulty breathing, headache or visual disturbances   Complete by: As directed    Call MD for:  hives   Complete by: As directed    Call MD for:  persistant nausea and vomiting   Complete by: As directed    Call MD for:  redness, tenderness, or signs of infection (pain, swelling, redness, odor or green/yellow discharge around incision site)   Complete by: As directed    Call MD for:  severe uncontrolled pain   Complete by: As directed    Diet - low sodium heart healthy   Complete by: As directed    Increase activity slowly   Complete by: As  directed    No dressing needed   Complete by: As directed       Allergies as of 12/27/2023       Reactions   Tramadol  Itching, Other (See Comments)   Itching and hallucinations   Ramipril Swelling        Medication List     TAKE these medications    allopurinol  100 MG tablet Commonly known as: ZYLOPRIM  Take 100 mg by mouth 2 (two) times daily.   amLODipine  10 MG tablet Commonly known as: NORVASC  Take 10 mg by mouth daily.   cyanocobalamin 1000 MCG tablet Commonly known as: VITAMIN B12 Take 1,000 mcg by mouth daily.   labetalol  200 MG tablet Commonly known as: NORMODYNE  Take 200 mg by mouth 2 (two) times daily.   methocarbamol  1000 MG/10ML injection Commonly known as: ROBAXIN  Inject 5 mLs (500 mg total) into the vein every 6 (six) hours as needed.   methylPREDNISolone 4 MG Tbpk tablet Commonly known as: MEDROL DOSEPAK follow package directions   oxyCODONE  5 MG immediate release tablet Commonly known as: Oxy IR/ROXICODONE  Take 1 tablet (5 mg total) by mouth every 4 (four) hours as needed for moderate pain (pain score 4-6). What changed:  when to take this reasons to take this   polyethylene glycol 17 g packet Commonly known as: MIRALAX  / GLYCOLAX  Take 17 g by mouth daily.   senna-docusate 8.6-50 MG tablet Commonly known as: Senokot-S Take 1 tablet by mouth at bedtime as needed for mild constipation.   Vitamin D (Ergocalciferol) 1.25 MG (50000 UNIT) Caps capsule Commonly known as: DRISDOL Take 50,000 Units by mouth once a week.               Discharge Care Instructions  (From admission, onward)  Start     Ordered   12/27/23 0000  No dressing needed        12/27/23 1059            Follow-up Information     Louis Shove, MD. Schedule an appointment as soon as possible for a visit in 4 week(s).   Specialty: Neurosurgery Contact information: [address] [place] [address] [phone]                  Signed: Gerard Beck, DNP, AGNP-C Nurse Practitioner  [facility] [address], [place], [place] [address] P: [phone]    F: [phone]  12/27/2023, 10:59 AM

## 2024-04-01 DEATH — deceased
